# Patient Record
Sex: Male | Born: 1975 | Race: White | Hispanic: No | Marital: Married | Smoking: Never smoker
Health system: Southern US, Community
[De-identification: ages and names within clinical notes are randomized; demographics above are authoritative.]

## PROBLEM LIST (undated history)

## (undated) DIAGNOSIS — T7840XA Allergy, unspecified, initial encounter: Secondary | ICD-10-CM

## (undated) HISTORY — DX: Allergy, unspecified, initial encounter: T78.40XA

---

## 2004-04-28 ENCOUNTER — Ambulatory Visit: Payer: Self-pay | Admitting: Internal Medicine

## 2004-09-11 ENCOUNTER — Ambulatory Visit: Payer: Self-pay | Admitting: Internal Medicine

## 2005-02-16 ENCOUNTER — Ambulatory Visit: Payer: Self-pay | Admitting: Internal Medicine

## 2005-11-22 ENCOUNTER — Ambulatory Visit: Payer: Self-pay | Admitting: Internal Medicine

## 2006-04-15 ENCOUNTER — Ambulatory Visit: Payer: Self-pay | Admitting: Internal Medicine

## 2006-08-05 ENCOUNTER — Ambulatory Visit: Payer: Self-pay | Admitting: Internal Medicine

## 2007-11-27 ENCOUNTER — Ambulatory Visit: Payer: Self-pay | Admitting: Internal Medicine

## 2007-11-27 DIAGNOSIS — M1710 Unilateral primary osteoarthritis, unspecified knee: Secondary | ICD-10-CM | POA: Insufficient documentation

## 2008-06-06 ENCOUNTER — Telehealth: Payer: Self-pay | Admitting: Internal Medicine

## 2008-07-24 ENCOUNTER — Telehealth: Payer: Self-pay | Admitting: Internal Medicine

## 2008-07-25 ENCOUNTER — Ambulatory Visit: Payer: Self-pay | Admitting: Internal Medicine

## 2008-07-25 DIAGNOSIS — J309 Allergic rhinitis, unspecified: Secondary | ICD-10-CM | POA: Insufficient documentation

## 2008-07-25 DIAGNOSIS — R05 Cough: Secondary | ICD-10-CM

## 2008-07-25 DIAGNOSIS — J189 Pneumonia, unspecified organism: Secondary | ICD-10-CM | POA: Insufficient documentation

## 2008-07-29 ENCOUNTER — Encounter: Payer: Self-pay | Admitting: Internal Medicine

## 2008-08-13 ENCOUNTER — Ambulatory Visit: Payer: Self-pay | Admitting: Internal Medicine

## 2008-08-13 DIAGNOSIS — B951 Streptococcus, group B, as the cause of diseases classified elsewhere: Secondary | ICD-10-CM

## 2009-05-22 ENCOUNTER — Telehealth: Payer: Self-pay | Admitting: Internal Medicine

## 2009-06-04 ENCOUNTER — Emergency Department (HOSPITAL_COMMUNITY): Admission: EM | Admit: 2009-06-04 | Discharge: 2009-06-04 | Payer: Self-pay | Admitting: Emergency Medicine

## 2010-10-29 ENCOUNTER — Encounter: Payer: Self-pay | Admitting: Family Medicine

## 2010-10-29 ENCOUNTER — Ambulatory Visit (INDEPENDENT_AMBULATORY_CARE_PROVIDER_SITE_OTHER): Payer: BC Managed Care – PPO | Admitting: Family Medicine

## 2010-10-29 VITALS — BP 100/70 | Temp 98.3°F | Wt 162.0 lb

## 2010-10-29 DIAGNOSIS — J029 Acute pharyngitis, unspecified: Secondary | ICD-10-CM

## 2010-10-29 NOTE — Progress Notes (Signed)
  Subjective:    Patient ID: Kevin Ortega, male    DOB: 05-Aug-1975, 35 y.o.   MRN: [id number]  HPI  patient seen with acute onset last night sore throat, possible low-grade fever, body aches, chills. No nasal congestion no cough. No nausea or vomiting. No skin rash. Some relief with ibuprofen. Possible exposure to strep recently.   Review of Systems  Constitutional: Positive for fever and chills.  HENT: Positive for sore throat. Negative for congestion and sinus pressure.   Respiratory: Negative for cough and shortness of breath.   Gastrointestinal: Negative for abdominal pain.       Objective:   Physical Exam  Constitutional: He appears well-developed and well-nourished. No distress.  HENT:  Right Ear: External ear normal.  Left Ear: External ear normal.       Patient has some erythema posterior pharynx. Scattered small aphthous ulcers. No exudate.  Cardiovascular: Normal rate, regular rhythm and normal heart sounds.   Pulmonary/Chest: Effort normal and breath sounds normal. No respiratory distress. He has no wheezes. He has no rales.  Skin: No rash noted.          Assessment & Plan:  Pharyngitis. Rule out strep. If rapid strep negative treat symptomatically

## 2010-10-29 NOTE — Patient Instructions (Signed)
Continue Advil for symptom relief. Drink plenty of fluids.

## 2010-11-03 ENCOUNTER — Ambulatory Visit (INDEPENDENT_AMBULATORY_CARE_PROVIDER_SITE_OTHER): Payer: BC Managed Care – PPO | Admitting: Family Medicine

## 2010-11-03 ENCOUNTER — Encounter: Payer: Self-pay | Admitting: Family Medicine

## 2010-11-03 VITALS — BP 110/78 | Temp 98.5°F

## 2010-11-03 DIAGNOSIS — R05 Cough: Secondary | ICD-10-CM

## 2010-11-03 DIAGNOSIS — H109 Unspecified conjunctivitis: Secondary | ICD-10-CM

## 2010-11-03 MED ORDER — TOBRAMYCIN 0.3 % OP SOLN: 1.0000 [drp] | OPHTHALMIC | Status: AC

## 2010-11-03 MED ORDER — HYDROCODONE-HOMATROPINE 5-1.5 MG/5ML PO SYRP: 5.0000 mL | ORAL_SOLUTION | ORAL | Status: AC | PRN

## 2010-11-03 MED ORDER — AZITHROMYCIN 250 MG PO TABS: 250.0000 mg | ORAL_TABLET | Freq: Every day | ORAL | Status: AC

## 2010-11-03 NOTE — Progress Notes (Signed)
  Subjective:    Patient ID: Kevin Ortega, male    DOB: 1976/03/13, 35 y.o.   MRN: [id number]  HPI Patient seen as a work in. Recently seen for pharyngitis with negative rapid strep. Now presents with new symptoms of laryngitis and cough which is mostly nonproductive. Sinus pressure and pain. Right erythematous and matted eye with early morning drainage. No blurred vision. Intermittent night sweats past week. Continued malaise. Not monitoring temperature.   Review of Systems  Constitutional: Positive for fatigue. Negative for fever, appetite change and unexpected weight change.  HENT: Positive for congestion and sore throat.   Respiratory: Positive for cough. Negative for shortness of breath and wheezing.   Cardiovascular: Negative for chest pain.       Objective:   Physical Exam  Constitutional: He appears well-developed and well-nourished. No distress.  HENT:  Right Ear: External ear normal.  Left Ear: External ear normal.       Small aphthous type ulcer uvula. Mild posterior pharynx erythema. No exudate. Right conjunctiva is erythematous. Pupils equal round reactive to light. No corneal defects  Neck: Neck supple.  Cardiovascular: Normal rate and regular rhythm.   No murmur heard. Pulmonary/Chest: Effort normal and breath sounds normal. No respiratory distress. He has no wheezes. He has no rales.  Lymphadenopathy:    He has no cervical adenopathy.          Assessment & Plan:  #1 right bacterial conjunctivitis. Tobrex eyedrops every 4 hours while awake and continue warm compresses #2 cough with question of some persistent fever. No evidence for pneumonia by exam. Given duration of symptoms start Zithromax. Hycodan for cough suppression

## 2010-11-26 ENCOUNTER — Encounter: Payer: Self-pay | Admitting: *Deleted

## 2010-11-27 ENCOUNTER — Encounter: Payer: Self-pay | Admitting: Internal Medicine

## 2010-11-27 ENCOUNTER — Ambulatory Visit (INDEPENDENT_AMBULATORY_CARE_PROVIDER_SITE_OTHER): Payer: BC Managed Care – PPO | Admitting: Internal Medicine

## 2010-11-27 VITALS — BP 120/78 | HR 72 | Temp 98.2°F | Resp 16 | Ht 72.0 in | Wt 160.0 lb

## 2010-11-27 DIAGNOSIS — L219 Seborrheic dermatitis, unspecified: Secondary | ICD-10-CM

## 2010-11-27 DIAGNOSIS — R05 Cough: Secondary | ICD-10-CM

## 2010-11-27 DIAGNOSIS — L210 Seborrhea capitis: Secondary | ICD-10-CM

## 2010-11-27 DIAGNOSIS — J321 Chronic frontal sinusitis: Secondary | ICD-10-CM | POA: Insufficient documentation

## 2010-11-27 DIAGNOSIS — J42 Unspecified chronic bronchitis: Secondary | ICD-10-CM | POA: Insufficient documentation

## 2010-11-27 MED ORDER — CLOTRIMAZOLE-BETAMETHASONE 1-0.05 % EX LOTN: TOPICAL_LOTION | Freq: Two times a day (BID) | CUTANEOUS | Status: AC

## 2010-11-27 MED ORDER — LEVOFLOXACIN 500 MG PO TABS: 500.0000 mg | ORAL_TABLET | Freq: Every day | ORAL | Status: AC

## 2010-11-27 NOTE — Patient Instructions (Signed)
Mucinex Fast  Max cough and cold liquid

## 2010-11-27 NOTE — Progress Notes (Signed)
  Subjective:    Patient ID: Kevin Ortega, male    DOB: 16-Jul-1975, 35 y.o.   MRN: [id number]  HPI  Patient is a 35 year old white male who presents after having had 2 courses of antibiotics in the past month for what was felt to be an upper respiratory tract infection.  The second was a Z-Pak he stated that it made him to temporarily better but a week later he was having congestion cough sore throat similar symptoms that he had before although not as disabling.  He denies any current fevers chills nausea or vomiting he has had a history of an upper respiratory tract infection that was diagnosed as pneumonia one year ago and a history of strep.    Review of Systems  Constitutional: Negative for fever and fatigue.  HENT: Positive for congestion and rhinorrhea. Negative for hearing loss, neck pain and postnasal drip.   Eyes: Negative for discharge, redness and visual disturbance.  Respiratory: Negative for cough, shortness of breath and wheezing.   Cardiovascular: Negative for leg swelling.  Gastrointestinal: Negative for abdominal pain, constipation and abdominal distention.  Genitourinary: Negative for urgency and frequency.  Musculoskeletal: Negative for joint swelling and arthralgias.  Skin: Negative for color change and rash.  Neurological: Positive for weakness. Negative for light-headedness.  Hematological: Negative for adenopathy.  Psychiatric/Behavioral: Negative for behavioral problems.   Past Medical History  Diagnosis Date  . Allergy    History reviewed. No pertinent past surgical history.  reports that he has never smoked. He does not have any smokeless tobacco history on file. He reports that he does not drink alcohol or use illicit drugs. family history is not on file. No Known Allergies     Objective:   Physical Exam  Constitutional: He is oriented to person, place, and time. He appears well-developed and well-nourished.  HENT:  Head: Normocephalic and  atraumatic.  Mouth/Throat: Oropharyngeal exudate present.  Eyes: Conjunctivae are normal. Pupils are equal, round, and reactive to light. Right eye exhibits discharge.  Neck: Normal range of motion. Neck supple.  Cardiovascular: Normal rate and regular rhythm.   Pulmonary/Chest: Effort normal and breath sounds normal.  Abdominal: Soft. Bowel sounds are normal.  Musculoskeletal: Normal range of motion.  Neurological: He is alert and oriented to person, place, and time.  Skin: Skin is warm and dry.          Assessment & Plan:  His physical examination today is most consistent with chronic sinusitis rather than acute pharyngitis  There is significant posterior nasal drip that is purulent in nature we will put him on Levaquin 500 by mouth daily for 10 days and Mucinex twice a day for 10 days.  Should his symptoms not abate he should be referred to an internist and throat doctor after a CT of the sinuses has been obtained.  He is due to complete physical examination and will be scheduled for physical in 3 or 4 months Next issue he pointed to a rash on the occiput of his head where his catheter causes moisture to accumulate as apparently a seborrheic or contact dermatitis from baseball cap and/or heart that we gave him Lotrisone cream to apply twice daily to the site when the rash appears and counseled him on of the hard hat band

## 2011-03-31 ENCOUNTER — Other Ambulatory Visit (INDEPENDENT_AMBULATORY_CARE_PROVIDER_SITE_OTHER): Payer: BC Managed Care – PPO

## 2011-03-31 DIAGNOSIS — Z0000 Encounter for general adult medical examination without abnormal findings: Secondary | ICD-10-CM

## 2011-03-31 LAB — CBC WITH DIFFERENTIAL/PLATELET
Basophils Relative: 0.4 % (ref 0.0–3.0)
Eosinophils Absolute: 0.1 10*3/uL (ref 0.0–0.7)
Eosinophils Relative: 2.3 % (ref 0.0–5.0)
Hemoglobin: 14.7 g/dL (ref 13.0–17.0)
Lymphocytes Relative: 21.6 % (ref 12.0–46.0)
MCHC: 34.6 g/dL (ref 30.0–36.0)
Monocytes Relative: 8 % (ref 3.0–12.0)
Neutro Abs: 3.5 10*3/uL (ref 1.4–7.7)
Neutrophils Relative %: 67.7 % (ref 43.0–77.0)
RBC: 4.68 Mil/uL (ref 4.22–5.81)
WBC: 5.2 10*3/uL (ref 4.5–10.5)

## 2011-03-31 LAB — POCT URINALYSIS DIPSTICK
Bilirubin, UA: NEGATIVE
Glucose, UA: NEGATIVE
Leukocytes, UA: NEGATIVE
Nitrite, UA: NEGATIVE
Urobilinogen, UA: 0.2
pH, UA: 7.5

## 2011-03-31 LAB — BASIC METABOLIC PANEL
CO2: 32 mEq/L (ref 19–32)
Calcium: 9.5 mg/dL (ref 8.4–10.5)
Creatinine, Ser: 0.9 mg/dL (ref 0.4–1.5)
GFR: 103.88 mL/min (ref >60.00)
Sodium: 141 mEq/L (ref 135–145)

## 2011-03-31 LAB — HEPATIC FUNCTION PANEL
AST: 31 U/L (ref 0–37)
Albumin: 4.5 g/dL (ref 3.5–5.2)
Alkaline Phosphatase: 57 U/L (ref 39–117)
Bilirubin, Direct: 0.1 mg/dL (ref 0.0–0.3)
Total Protein: 7.2 g/dL (ref 6.0–8.3)

## 2011-03-31 LAB — LIPID PANEL
HDL: 56.4 mg/dL (ref >39.00)
Total CHOL/HDL Ratio: 3

## 2011-04-07 ENCOUNTER — Ambulatory Visit (INDEPENDENT_AMBULATORY_CARE_PROVIDER_SITE_OTHER): Payer: BC Managed Care – PPO | Admitting: Internal Medicine

## 2011-04-07 ENCOUNTER — Encounter: Payer: Self-pay | Admitting: Internal Medicine

## 2011-04-07 VITALS — BP 124/78 | HR 60 | Temp 98.1°F | Resp 14 | Ht 70.0 in | Wt 163.0 lb

## 2011-04-07 DIAGNOSIS — Z23 Encounter for immunization: Secondary | ICD-10-CM

## 2011-04-07 DIAGNOSIS — A4902 Methicillin resistant Staphylococcus aureus infection, unspecified site: Secondary | ICD-10-CM

## 2011-04-07 DIAGNOSIS — Z0000 Encounter for general adult medical examination without abnormal findings: Secondary | ICD-10-CM

## 2011-04-07 MED ORDER — DOXYCYCLINE HYCLATE 100 MG PO TABS: 100.0000 mg | ORAL_TABLET | Freq: Two times a day (BID) | ORAL | Status: AC

## 2011-04-07 MED ORDER — DOXYCYCLINE HYCLATE 100 MG PO TABS: 100.0000 mg | ORAL_TABLET | Freq: Two times a day (BID) | ORAL | Status: DC

## 2011-04-07 NOTE — Patient Instructions (Signed)
The patient is instructed to continue all medications as prescribed. Schedule followup with check out clerk upon leaving the clinic  

## 2011-04-07 NOTE — Progress Notes (Signed)
  Subjective:    Patient ID: Kevin Ortega, male    DOB: 19-Sep-1975, 35 y.o.   MRN: [id number]  HPI  cpx Has pustules on legs that occur and then heal They began after October on a hunting trip No exposure  "bugs bites" New since october  Review of Systems  Constitutional: Negative for fever and fatigue.  HENT: Negative for hearing loss, congestion, neck pain and postnasal drip.   Eyes: Negative for discharge, redness and visual disturbance.  Respiratory: Negative for cough, shortness of breath and wheezing.   Cardiovascular: Negative for leg swelling.  Gastrointestinal: Negative for abdominal pain, constipation and abdominal distention.  Genitourinary: Negative for urgency and frequency.  Musculoskeletal: Negative for joint swelling and arthralgias.  Skin: Negative for color change and rash.  Neurological: Negative for weakness and light-headedness.  Hematological: Negative for adenopathy.  Psychiatric/Behavioral: Negative for behavioral problems.   Past Medical History  Diagnosis Date  . Allergy    No past surgical history on file.  reports that he has never smoked. He does not have any smokeless tobacco history on file. He reports that he does not drink alcohol or use illicit drugs. family history is not on file. No Known Allergies      Objective:   Physical Exam  Nursing note and vitals reviewed. Constitutional: He appears well-developed and well-nourished.  HENT:  Head: Normocephalic and atraumatic.  Eyes: Conjunctivae are normal. Pupils are equal, round, and reactive to light.  Neck: Normal range of motion. Neck supple.  Cardiovascular: Normal rate and regular rhythm.   Pulmonary/Chest: Effort normal and breath sounds normal.  Abdominal: Soft. Bowel sounds are normal.   Examination the skin reveals small posterior lesions on the thighs anteriorly bilateral no other lesions are seen other parts of the skin testicles are normal bilateral no evidence of hernia  prostate is normal size and consistency neurologic examination was nonfocal neuromuscular examination normal examination extremities normal       Assessment & Plan:   Patient presents for yearly preventative medicine examination.   all immunizations and health maintenance protocols were reviewed with the patient and they are up to date with these protocols.   screening laboratory values were reviewed with the patient including screening of hyperlipidemia PSA renal function and hepatic function.   There medications past medical history social history problem list and allergies were reviewed in detail.   Goals were established with regard to weight loss exercise diet in compliance with medications

## 2011-05-31 ENCOUNTER — Telehealth: Payer: Self-pay | Admitting: *Deleted

## 2011-05-31 MED ORDER — CEPHALEXIN 500 MG PO CAPS: 500.0000 mg | ORAL_CAPSULE | Freq: Four times a day (QID) | ORAL | Status: AC

## 2011-05-31 NOTE — Telephone Encounter (Signed)
Pt states the staph infection cleared up but is returning.

## 2011-05-31 NOTE — Telephone Encounter (Signed)
Per dr Lovell Sheehan- may have keflex 500 4 times a day for 7 days

## 2011-05-31 NOTE — Telephone Encounter (Signed)
Pt. Notified.

## 2011-06-21 ENCOUNTER — Telehealth: Payer: Self-pay | Admitting: Internal Medicine

## 2011-06-21 MED ORDER — SULFAMETHOXAZOLE-TRIMETHOPRIM 800-160 MG PO TABS: 1.0000 | ORAL_TABLET | Freq: Two times a day (BID) | ORAL | Status: AC

## 2011-06-21 NOTE — Telephone Encounter (Signed)
Pt called and said that staph inf has still not cleared up. 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 Pt wants to know if he would still be ok to go to appt? The infection is somewhat better, but not completely gone. Pls advise. Pt does need refill of Keflex.

## 2011-06-21 NOTE — Telephone Encounter (Signed)
Per dr Lovell Sheehan- as long as the infection is not in the area where surgery will be it is ok- if it is let us know-- per dr Lovell Sheehan give him septra ds bid for 14 days

## 2011-06-21 NOTE — Telephone Encounter (Signed)
Pt.notified

## 2012-03-22 ENCOUNTER — Encounter: Payer: Self-pay | Admitting: Internal Medicine

## 2012-03-22 ENCOUNTER — Ambulatory Visit (INDEPENDENT_AMBULATORY_CARE_PROVIDER_SITE_OTHER): Payer: BC Managed Care – PPO | Admitting: Internal Medicine

## 2012-03-22 ENCOUNTER — Ambulatory Visit (INDEPENDENT_AMBULATORY_CARE_PROVIDER_SITE_OTHER)
Admission: RE | Admit: 2012-03-22 | Discharge: 2012-03-22 | Disposition: A | Discharge status: Home / Self Care | Payer: BC Managed Care – PPO | Source: Ambulatory Visit | Attending: Internal Medicine | Admitting: Internal Medicine

## 2012-03-22 VITALS — BP 130/80 | HR 72 | Temp 98.3°F | Resp 16 | Ht 70.0 in | Wt 162.0 lb

## 2012-03-22 DIAGNOSIS — R109 Unspecified abdominal pain: Secondary | ICD-10-CM

## 2012-03-22 DIAGNOSIS — K650 Generalized (acute) peritonitis: Secondary | ICD-10-CM

## 2012-03-22 LAB — COMPREHENSIVE METABOLIC PANEL
AST: 25 U/L (ref 0–37)
Albumin: 4 g/dL (ref 3.5–5.2)
Alkaline Phosphatase: 54 U/L (ref 39–117)
BUN: 16 mg/dL (ref 6–23)
Glucose, Bld: 75 mg/dL (ref 70–99)
Potassium: 3.9 mEq/L (ref 3.5–5.1)
Total Bilirubin: 0.6 mg/dL (ref 0.3–1.2)

## 2012-03-22 LAB — CBC WITH DIFFERENTIAL/PLATELET
Basophils Relative: 0.3 % (ref 0.0–3.0)
Eosinophils Absolute: 0.1 10*3/uL (ref 0.0–0.7)
Eosinophils Relative: 1 % (ref 0.0–5.0)
HCT: 39.6 % (ref 39.0–52.0)
Hemoglobin: 13.2 g/dL (ref 13.0–17.0)
Lymphs Abs: 1 10*3/uL (ref 0.7–4.0)
MCHC: 33.4 g/dL (ref 30.0–36.0)
MCV: 89.3 fl (ref 78.0–100.0)
Monocytes Absolute: 0.6 10*3/uL (ref 0.1–1.0)
Neutro Abs: 4.7 10*3/uL (ref 1.4–7.7)
Neutrophils Relative %: 73.7 % (ref 43.0–77.0)
RBC: 4.43 Mil/uL (ref 4.22–5.81)
WBC: 6.4 10*3/uL (ref 4.5–10.5)

## 2012-03-22 MED ORDER — IOHEXOL 300 MG/ML  SOLN
100.0000 mL | Freq: Once | INTRAMUSCULAR | Status: AC | PRN
  Administered 2012-03-22: 100 mL via INTRAVENOUS

## 2012-03-22 MED ORDER — CEFTRIAXONE SODIUM 1 G IJ SOLR
1.0000 g | INTRAMUSCULAR | Status: AC
  Administered 2012-03-22: 1 g via INTRAMUSCULAR

## 2012-03-22 NOTE — Progress Notes (Signed)
  Subjective:    Patient ID: Kevin Ortega, male    DOB: 1975-12-22, 36 y.o.   MRN: [id number]  HPI Has noted early satiety, bloating and radiation to the back Has noted less BM but normal in color Has mild hernia pain from left inguinal hernia Has not noted gas No dark stools No unexpected weight loss No ETOH No tobacco Patient has extreme tenderness of his abdomen wall and states that he has been bloating he is a thin individual has never noted abdominal bloating before     Review of Systems  Constitutional: Positive for fever and fatigue.  HENT: Negative for hearing loss, congestion, neck pain and postnasal drip.   Eyes: Negative for discharge, redness and visual disturbance.  Respiratory: Negative for cough, shortness of breath and wheezing.   Cardiovascular: Negative for leg swelling.  Gastrointestinal: Positive for abdominal pain, constipation and abdominal distention.  Genitourinary: Negative for urgency and frequency.  Musculoskeletal: Positive for back pain. Negative for joint swelling and arthralgias.  Skin: Negative for color change and rash.  Neurological: Negative for weakness and light-headedness.  Hematological: Negative for adenopathy.  Psychiatric/Behavioral: Negative for behavioral problems.   Past Medical History  Diagnosis Date  . Allergy     History   Social History  . Marital Status: Married    Spouse Name: N/A    Number of Children: N/A  . Years of Education: N/A   Occupational History  . Not on file.   Social History Main Topics  . Smoking status: Never Smoker   . Smokeless tobacco: Not on file  . Alcohol Use: No  . Drug Use: No  . Sexually Active: Yes   Other Topics Concern  . Not on file   Social History Narrative  . No narrative on file    No past surgical history on file.  No family history on file.  No Known Allergies  No current outpatient prescriptions on file prior to visit.    BP 130/80  Pulse 72  Temp 98.3 F  (36.8 C)  Resp 16  Ht 5\' 10"  (1.778 m)  Wt 162 lb (73.483 kg)  BMI 23.24 kg/m2       Objective:   Physical Exam  Nursing note and vitals reviewed. Constitutional: He appears well-developed and well-nourished.  Cardiovascular: Normal rate and regular rhythm.   No murmur heard. Pulmonary/Chest: Effort normal and breath sounds normal. No respiratory distress. He has no wheezes. He has no rales.  Abdominal: He exhibits distension. He exhibits no mass. There is tenderness. There is rebound and guarding.  Genitourinary:       Left indirect inguinal hernia  Musculoskeletal: Normal range of motion.  Skin: Skin is warm and dry.  Psychiatric: He has a normal mood and affect. His behavior is normal.          Assessment & Plan:  The differential diagnosis was abdominal pain includes probable subacute appendicitis with peritonitis but also would include other etiologies such as a perforated peptic ulcer.  Will do CBC differential and a stat CT of the abdomen and pelvis this afternoon. I've also suggested that he begin a proton pump inhibitor as well as an injection of Rocephin 1 g we'll obtain the blood work first before the Rocephin injection

## 2012-03-22 NOTE — Addendum Note (Signed)
Addended by: Willy Eddy on: 03/22/2012 12:32 PM   Modules accepted: Orders

## 2012-03-22 NOTE — Patient Instructions (Signed)
CT scan today at 1 PM at  [facility] radiology on [facility] in the bloating marked [facility] on the third floor

## 2013-06-20 IMAGING — CT CT ABD-PELV W/ CM
2 of 4 series · 17 of 46 positions shown, 19 images · IV contrast (Omnipaque 300)
Comparison: None.

CLINICAL DATA: Right arm pain.. Peritoneal signs.

CT ABDOMEN AND PELVIS WITH CONTRAST
TECHNIQUE: Multidetector CT imaging of the abdomen and pelvis was
performed following the standard protocol during bolus
administration of intravenous contrast.
Contrast: 100mL OMNIPAQUE IOHEXOL 300 MG/ML  SOLN

[Series 2: abd/ pel 5mm · axial · 0.63mm/px · z∈[-492,-62]mm · 14 of 94 slices shown, 16 images]
[im 4/94  soft-tissue]
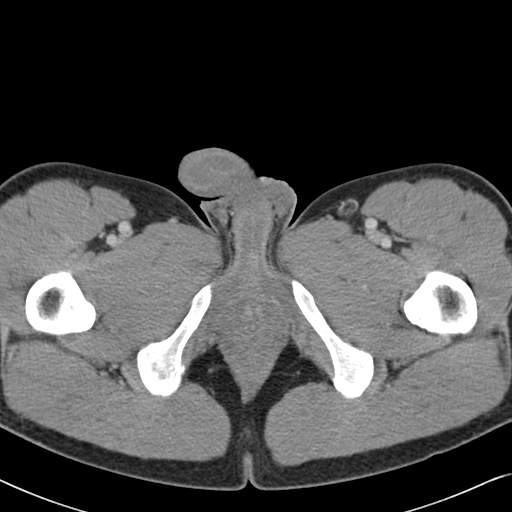
[im 4/94  bone]
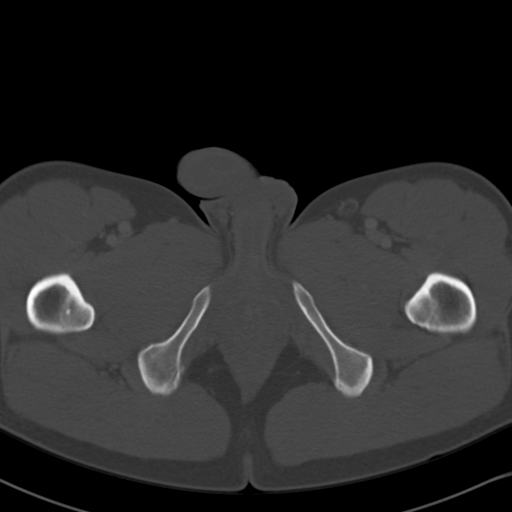
[im 11/94  soft-tissue]
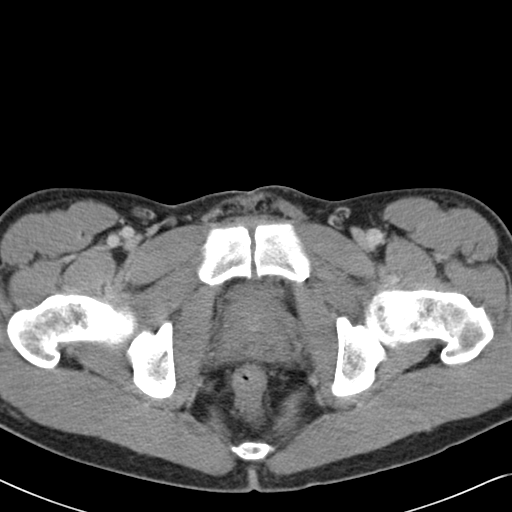
[im 18/94  soft-tissue]
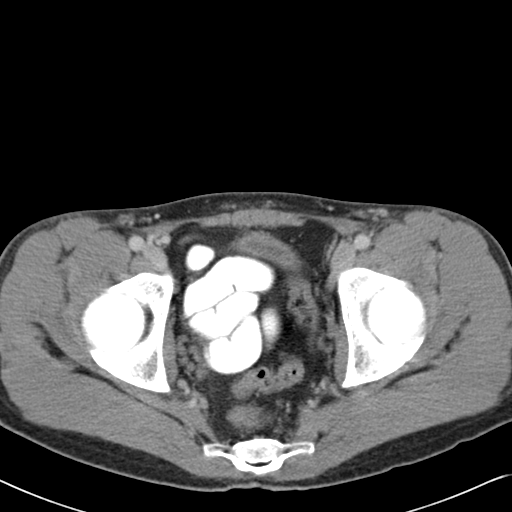
[im 26/94  soft-tissue]
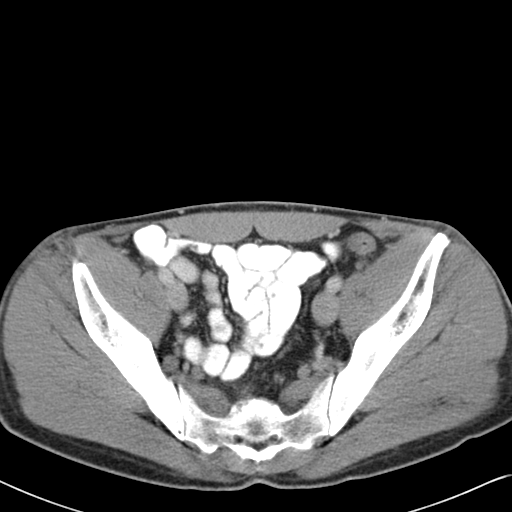
[im 33/94  soft-tissue]
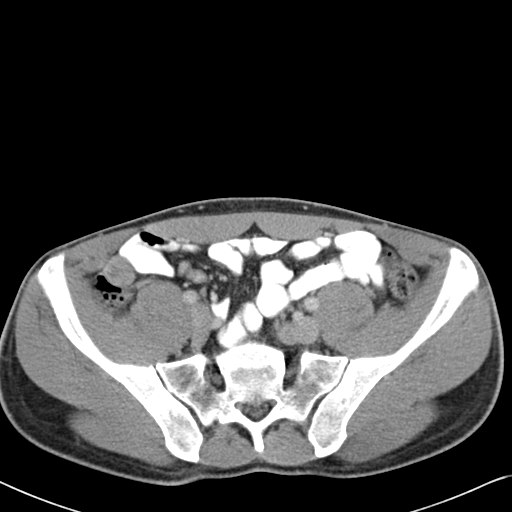
[im 36/94  soft-tissue]
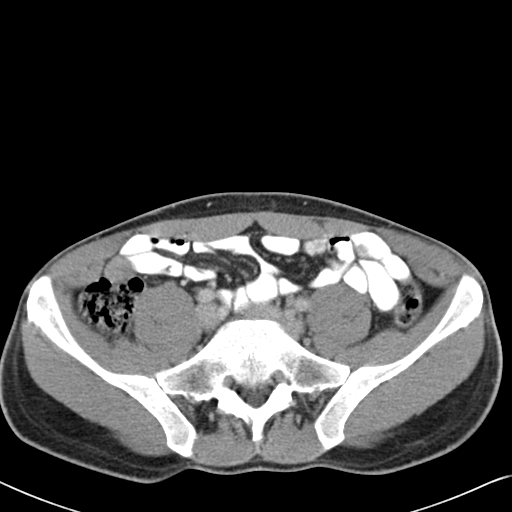
[im 43/94  soft-tissue]
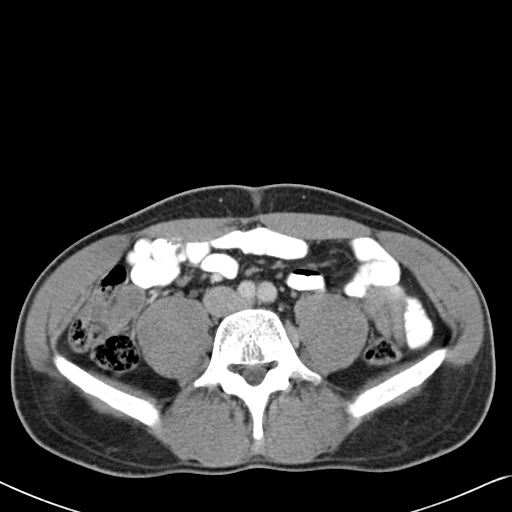
[im 51/94  soft-tissue]
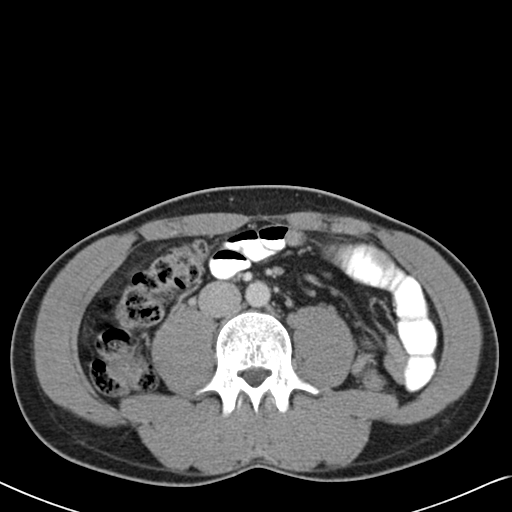
[im 58/94  soft-tissue]
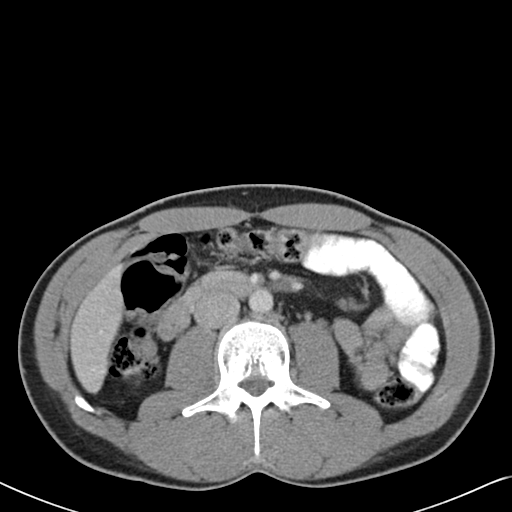
[im 58/94  bone]
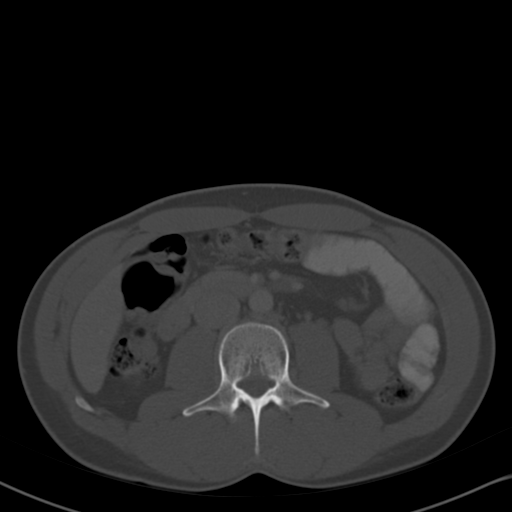
[im 61/94  soft-tissue]
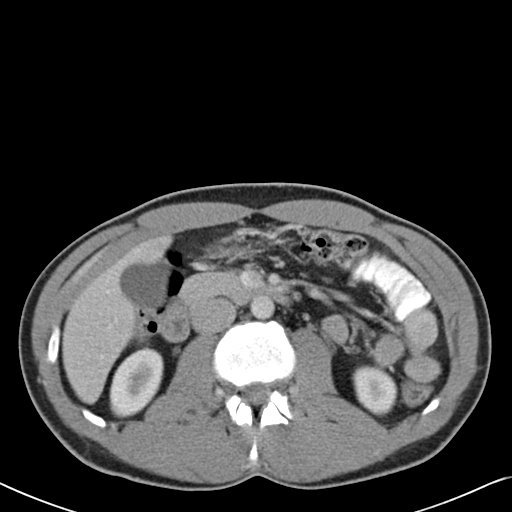
[im 68/94  soft-tissue]
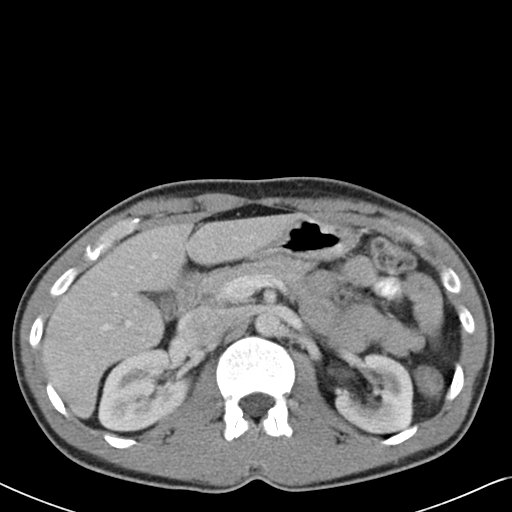
[im 76/94  soft-tissue]
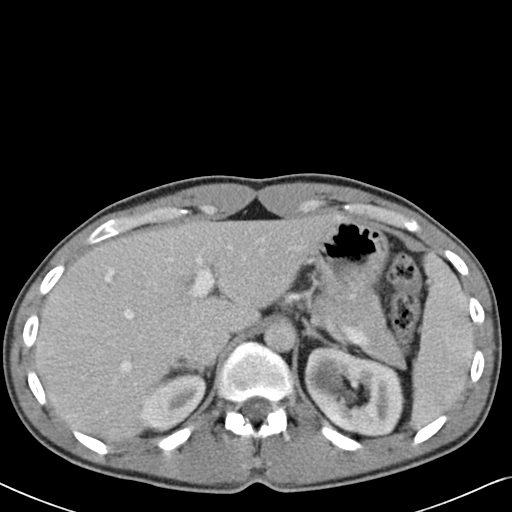
[im 83/94  soft-tissue]
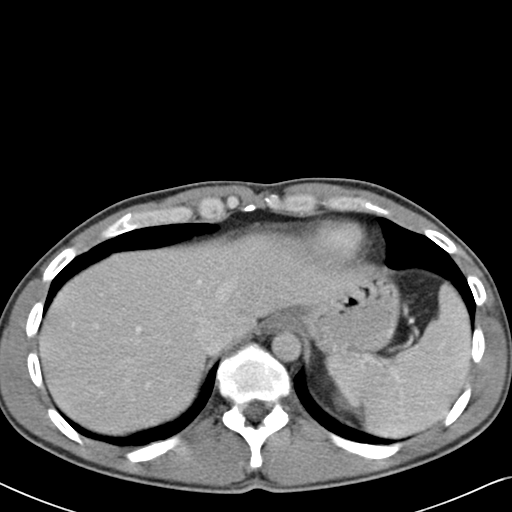
[im 90/94  soft-tissue]
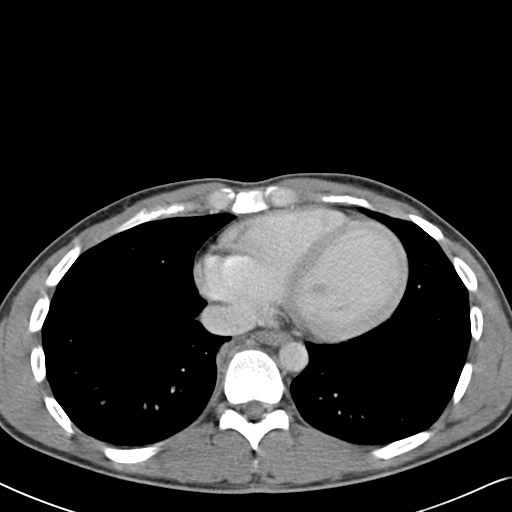

[Series 602: cor · coronal · 0.95mm/px · 3 of 84 slices shown]
[im 28/84  soft-tissue]
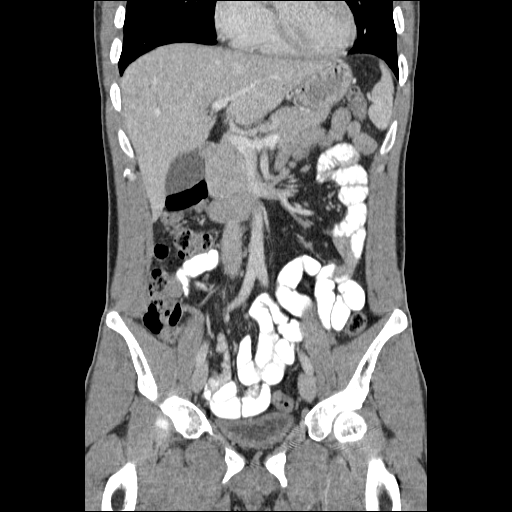
[im 37/84  soft-tissue]
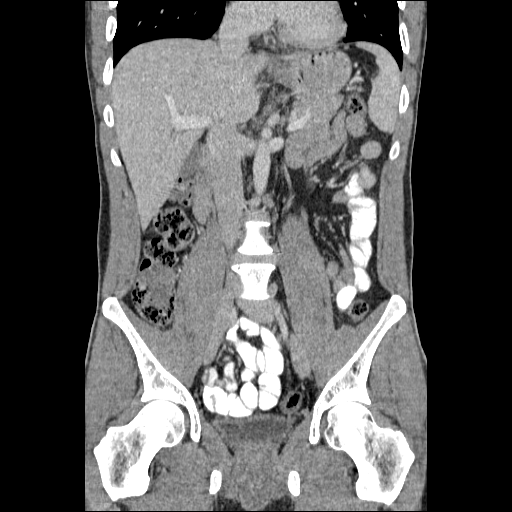
[im 47/84  soft-tissue]
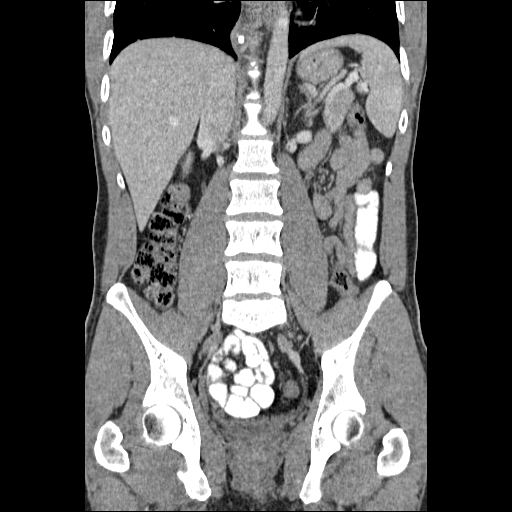

[17 of 46 positions shown; findings below may reference images not displayed]

FINDINGS: Lung bases are clear.  No pleural or pericardial fluid.
The liver has a normal appearance without focal lesions or biliary
ductal dilatation.  No calcified gallstones.  The spleen is normal.
The pancreas is normal.  The adrenal glands are normal.  The right
kidney is normal.  The left kidney contains a benign-appearing cyst
at the upper pole measuring 3.4 cm in diameter.  The aorta and IVC
are normal.  No retroperitoneal mass or adenopathy.  Bladder,
prostate gland and seminal vesicles are normal.

The appendix is normal.  There is no acute bowel finding.  No free
fluid or air.  No significant bony abnormality.
IMPRESSION: Negative examination.  Normal appearing appendix.  No cause of
right lower quadrant pain or peritoneal signs.

## 2013-08-20 ENCOUNTER — Telehealth: Payer: Self-pay | Admitting: Internal Medicine

## 2013-08-20 MED ORDER — ONDANSETRON HCL 4 MG PO TABS: 4.0000 mg | ORAL_TABLET | Freq: Three times a day (TID) | ORAL | Status: DC | PRN

## 2013-08-20 NOTE — Telephone Encounter (Signed)
Wife called for pt to ask if dr Lovell Sheehanjenkins cold call rx in for nausea. Pt has a stomach virus. Advised pt  he may need appt. Pt insisted to ask, stating dr Lovell Sheehanjenkins usually will call in something for them, pt not seen since 02/2012 pls advise

## 2013-08-20 NOTE — Telephone Encounter (Signed)
Zofran okay per Dr Lovell SheehanJenkins.  Patient is aware.

## 2015-07-30 ENCOUNTER — Ambulatory Visit: Payer: BLUE CROSS/BLUE SHIELD

## 2015-08-13 ENCOUNTER — Telehealth: Payer: Self-pay | Admitting: Family Medicine

## 2015-08-13 NOTE — Telephone Encounter (Signed)
Ok. Please schedule new pt/physical if not health issues.

## 2015-08-13 NOTE — Telephone Encounter (Signed)
Pt was a jenkins pt and his wife Percival Spanish(Renee Nippert) is a pt of your. She would like to know if you will accept him as a pt? Pt is not sick, just needs a doctor.

## 2015-08-13 NOTE — Telephone Encounter (Signed)
Kevin Ortega - [facility] me [facility]. You can cal and schedule this patient.

## 2015-09-12 NOTE — Telephone Encounter (Signed)
Pt has been scheduled.  °

## 2015-09-16 ENCOUNTER — Ambulatory Visit (INDEPENDENT_AMBULATORY_CARE_PROVIDER_SITE_OTHER): Payer: BLUE CROSS/BLUE SHIELD | Admitting: Family Medicine

## 2015-09-16 ENCOUNTER — Other Ambulatory Visit (HOSPITAL_COMMUNITY)
Admission: RE | Admit: 2015-09-16 | Discharge: 2015-09-16 | Disposition: A | Discharge status: Home / Self Care | Payer: BLUE CROSS/BLUE SHIELD | Source: Ambulatory Visit | Attending: Family Medicine | Admitting: Family Medicine

## 2015-09-16 ENCOUNTER — Encounter: Payer: Self-pay | Admitting: Family Medicine

## 2015-09-16 VITALS — BP 102/76 | HR 73 | Temp 98.4°F | Ht 69.75 in | Wt 167.4 lb

## 2015-09-16 DIAGNOSIS — Z0001 Encounter for general adult medical examination with abnormal findings: Secondary | ICD-10-CM

## 2015-09-16 DIAGNOSIS — S30861A Insect bite (nonvenomous) of abdominal wall, initial encounter: Secondary | ICD-10-CM | POA: Diagnosis not present

## 2015-09-16 DIAGNOSIS — W57XXXA Bitten or stung by nonvenomous insect and other nonvenomous arthropods, initial encounter: Secondary | ICD-10-CM

## 2015-09-16 DIAGNOSIS — Z113 Encounter for screening for infections with a predominantly sexual mode of transmission: Secondary | ICD-10-CM | POA: Insufficient documentation

## 2015-09-16 DIAGNOSIS — Z7689 Persons encountering health services in other specified circumstances: Secondary | ICD-10-CM

## 2015-09-16 DIAGNOSIS — Z0000 Encounter for general adult medical examination without abnormal findings: Secondary | ICD-10-CM | POA: Diagnosis not present

## 2015-09-16 DIAGNOSIS — R361 Hematospermia: Secondary | ICD-10-CM

## 2015-09-16 DIAGNOSIS — Z7189 Other specified counseling: Secondary | ICD-10-CM | POA: Diagnosis not present

## 2015-09-16 LAB — CBC WITH DIFFERENTIAL/PLATELET
BASOS PCT: 0.4 % (ref 0.0–3.0)
Basophils Absolute: 0 10*3/uL (ref 0.0–0.1)
EOS ABS: 0.1 10*3/uL (ref 0.0–0.7)
Eosinophils Relative: 2.5 % (ref 0.0–5.0)
HCT: 41.4 % (ref 39.0–52.0)
HEMOGLOBIN: 14.1 g/dL (ref 13.0–17.0)
LYMPHS ABS: 0.9 10*3/uL (ref 0.7–4.0)
Lymphocytes Relative: 17.9 % (ref 12.0–46.0)
MCHC: 34.2 g/dL (ref 30.0–36.0)
MCV: 88.6 fl (ref 78.0–100.0)
MONO ABS: 0.4 10*3/uL (ref 0.1–1.0)
Monocytes Relative: 8.4 % (ref 3.0–12.0)
NEUTROS PCT: 70.8 % (ref 43.0–77.0)
Neutro Abs: 3.6 10*3/uL (ref 1.4–7.7)
Platelets: 204 10*3/uL (ref 150.0–400.0)
RBC: 4.67 Mil/uL (ref 4.22–5.81)
RDW: 13.3 % (ref 11.5–15.5)
WBC: 5.1 10*3/uL (ref 4.0–10.5)

## 2015-09-16 LAB — BASIC METABOLIC PANEL
BUN: 14 mg/dL (ref 6–23)
CHLORIDE: 101 meq/L (ref 96–112)
CO2: 33 mEq/L — ABNORMAL HIGH (ref 19–32)
CREATININE: 1.02 mg/dL (ref 0.40–1.50)
Calcium: 9.8 mg/dL (ref 8.4–10.5)
GFR: 86.57 mL/min (ref >60.00)
Glucose, Bld: 78 mg/dL (ref 70–99)
Potassium: 4.2 mEq/L (ref 3.5–5.1)
Sodium: 139 mEq/L (ref 135–145)

## 2015-09-16 LAB — POC URINALSYSI DIPSTICK (AUTOMATED)
Bilirubin, UA: NEGATIVE
Glucose, UA: NEGATIVE
Ketones, UA: NEGATIVE
Leukocytes, UA: NEGATIVE
Nitrite, UA: NEGATIVE
PH UA: 7
PROTEIN UA: NEGATIVE
RBC UA: NEGATIVE
SPEC GRAV UA: 1.02
UROBILINOGEN UA: 0.2

## 2015-09-16 LAB — HDL CHOLESTEROL: HDL: 50.6 mg/dL (ref >39.00)

## 2015-09-16 LAB — CHOLESTEROL, TOTAL: Cholesterol: 180 mg/dL (ref 0–200)

## 2015-09-16 MED ORDER — DOXYCYCLINE HYCLATE 100 MG PO CAPS
100.0000 mg | ORAL_CAPSULE | Freq: Two times a day (BID) | ORAL | Status: DC
Start: 2015-09-16 — End: 2017-08-22

## 2015-09-16 NOTE — Patient Instructions (Signed)
BEFORE YOU LEAVE: -labs -schedule follow up in 1 month  Take the antibiotic as instructed  We recommend the following healthy lifestyle measures: - eat a healthy whole foods diet consisting of regular small meals composed of vegetables, fruits, beans, nuts, seeds, healthy meats such as white chicken and fish and whole grains.  - avoid sweets, white starchy foods, fried foods, fast food, processed foods, sodas, red meet and other fattening foods.  - get a least 150-300 minutes of aerobic exercise per week.   -We have ordered labs or studies at this visit. It can take up to 1-2 weeks for results and processing. We will contact you with instructions IF your results are abnormal. Normal results will [facility]. If you have not heard from us or can not find your results [facility] in 2 weeks please contact our office.

## 2015-09-16 NOTE — Progress Notes (Signed)
HPI:  Here for CPE:  -Concerns and/or follow up today:   Tick bite: -pulled tick off 3 days ago, bit him that day -L flank, a little irritated and swollen at bite site -no fevers, malaise, rash or flu like symptoms  Dysparunia: -for about 1.5 months -mild discomfort with ejaculation -streaks of blood in semen -denies: dysuria, fever, chills, flank pain, hematuria, weak urine stream, testicular pain, malaise, weight loss, nausea, vomiting, pain other times or concern for STIs -no FH urological disorders or cancers  -Diabetes and Dyslipidemia Screening: not fasting  -Hx of HTN: no  -Vaccines: UTD  -sexual activity: yes, male partner, no new partners  -wants STI testing, Hep C screening (if born [id number]): no  -FH colon or prstate ca: see FH Last colon cancer screening: n/a Last prostate ca screening: n/a  -Alcohol, Tobacco, drug use: see social history  Review of Systems - no fevers, unintentional weight loss, vision loss, hearing loss, chest pain, sob, hemoptysis, melena, hematochezia, hematuria, genital discharge, changing or concerning skin lesions, bleeding, bruising, loc, thoughts of self harm or SI  Past Medical History  Diagnosis Date  . Allergy     No past surgical history on file.  No family history on file.  Social History   Social History  . Marital Status: Married    Spouse Name: N/A  . Number of Children: N/A  . Years of Education: N/A   Social History Main Topics  . Smoking status: Never Smoker   . Smokeless tobacco: None  . Alcohol Use: No  . Drug Use: No  . Sexual Activity: Yes   Other Topics Concern  . None   Social History Narrative   Work or School: self employed interior [employer]      Home Situation: lives with wife and two son      Spiritual Beliefs: Christian      Lifestyle: no regular exercise - work is active and has a garden; diet is pretty healthy           Current outpatient prescriptions:  .  doxycycline  (VIBRAMYCIN) 100 MG capsule, Take 1 capsule (100 mg total) by mouth 2 (two) times daily., Disp: 28 capsule, Rfl: 0  EXAM:  Filed Vitals:   09/16/15 0740  BP: 102/76  Pulse: 73  Temp: 98.4 F (36.9 C)  TempSrc: Oral  Height: 5' 9.75" (1.772 m)  Weight: 167 lb 6.4 oz (75.932 kg)    Estimated body mass index is 24.18 kg/(m^2) as calculated from the following:   Height as of this encounter: 5' 9.75" (1.772 m).   Weight as of this encounter: 167 lb 6.4 oz (75.932 kg).  GENERAL: vitals reviewed and listed below, alert, oriented, appears well hydrated and in no acute distress  HEENT: head atraumatic, PERRLA, normal appearance of eyes, ears, nose and mouth. moist mucus membranes.  NECK: supple, no masses or lymphadenopathy  LUNGS: clear to auscultation bilaterally, no rales, rhonchi or wheeze  CV: HRRR, no peripheral edema or cyanosis, normal pedal pulses  ABDOMEN: bowel sounds normal, soft, non tender to palpation, no masses, no rebound or guarding  GU: normal appearance of external genitalia - no lesions or masses, hernia exam normal.  RECTAL: normal  SKIN: no rash or abnormal lesions, many moles, none appear unusual - erythematous papule with small area surrounding edema and induration L upper lateral thigh  MS: normal gait, moves all extremities normally  NEURO: CN II-XII grossly intact, normal muscle strength and sensation to light touch on  extremities  PSYCH: normal affect, pleasant and cooperative  ASSESSMENT AND PLAN:  Discussed the following assessment and plan:  Tick bite - Plan: doxycycline (VIBRAMYCIN) 100 MG capsule -discussed tick born illnesses -mild cellulitis - tx with doxy  Blood in semen - Plan: doxycycline (VIBRAMYCIN) 100 MG capsule, Basic metabolic panel, CBC with Differential -we discussed possible serious and likely etiologies, workup and treatment, treatment risks and return precautions -normal exam -labs, urine studies - assistant to order udip,  micro, culture, GC/Clam -tx with doxy after discussion options/risks -f/u in 1 month to ensure resolution  Encounter to establish care with new doctor Visit for preventive health examination - Plan: Cholesterol, Total, HDL cholesterol -Discussed and advised all [place] preventive services health task force level A and B recommendations for age, sex and risks.  -Advised at least 150 minutes of exercise per week and a healthy diet low in saturated fats and sweets and consisting of fresh fruits and vegetables, lean meats such as fish and white chicken and whole grains.  -labs, studies and vaccines per orders this encounter  Yearly skin exam advised  Patient advised to return to clinic immediately if symptoms worsen or persist or new concerns.  Patient Instructions  BEFORE YOU LEAVE: -labs -schedule follow up in 1 month  Take the antibiotic as instructed  We recommend the following healthy lifestyle measures: - eat a healthy whole foods diet consisting of regular small meals composed of vegetables, fruits, beans, nuts, seeds, healthy meats such as white chicken and fish and whole grains.  - avoid sweets, white starchy foods, fried foods, fast food, processed foods, sodas, red meet and other fattening foods.  - get a least 150-300 minutes of aerobic exercise per week.   -We have ordered labs or studies at this visit. It can take up to 1-2 weeks for results and processing. We will contact you with instructions IF your results are abnormal. Normal results will be released to your [facility]. If you have not heard from [place] or can not find your results in [facility] in 2 weeks please contact our office.           No Follow-up on file.   Kriste Basque R.

## 2015-09-17 LAB — URINE CYTOLOGY ANCILLARY ONLY
Chlamydia: NEGATIVE
NEISSERIA GONORRHEA: NEGATIVE

## 2015-09-18 LAB — URINE CULTURE
Colony Count: NO GROWTH
Organism ID, Bacteria: NO GROWTH

## 2015-10-16 ENCOUNTER — Ambulatory Visit: Payer: BLUE CROSS/BLUE SHIELD | Admitting: Family Medicine

## 2017-08-19 NOTE — Progress Notes (Signed)
HPI:  Using dictation device. Unfortunately this device frequently misinterprets words/phrases.  Here for CPE:  -Concerns and/or follow up today: none  -Diet: variety of foods, balance and well rounded, mostly home cooked an dlikes to garden -Exercise: reports he gets regular exercise -Diabetes and Dyslipidemia Screening:fasting for labs -Hx of HTN: no -Vaccines: UTD, except declined flu vaccine -wants STI testing, Hep C screening (if born 47-1964): no - declined -FH colon or prstate ca: see FH Last colon cancer screening: n/a Last prostate ca screening:n/a -Alcohol, Tobacco, drug use: see social history  Review of Systems - no reported fevers, unintentional weight loss, vision loss, hearing loss, chest pain, sob, hemoptysis, melena, hematochezia, hematuria, genital discharge, changing or concerning skin lesions, bleeding, bruising, loc, thoughts of self harm or SI  Past Medical History:  Diagnosis Date  . Allergy     History reviewed. No pertinent surgical history.  History reviewed. No pertinent family history.  Social History   Socioeconomic History  . Marital status: Married    Spouse name: Not on file  . Number of children: Not on file  . Years of education: Not on file  . Highest education level: Not on file  Occupational History  . Not on file  Social Needs  . Financial resource strain: Not on file  . Food insecurity:    Worry: Not on file    Inability: Not on file  . Transportation needs:    Medical: Not on file    Non-medical: Not on file  Tobacco Use  . Smoking status: Never Smoker  . Smokeless tobacco: Never Used  Substance and Sexual Activity  . Alcohol use: No  . Drug use: No  . Sexual activity: Yes  Lifestyle  . Physical activity:    Days per week: Not on file    Minutes per session: Not on file  . Stress: Not on file  Relationships  . Social connections:    Talks on phone: Not on file    Gets together: Not on file    Attends religious  service: Not on file    Active member of club or organization: Not on file    Attends meetings of clubs or organizations: Not on file    Relationship status: Not on file  Other Topics Concern  . Not on file  Social History Narrative   Work or School: self employed [employer] Situation: lives with wife and two son      Spiritual Beliefs: Christian      Lifestyle: no regular exercise - work is active and has a garden; diet is pretty healthy       No current outpatient medications on file.  EXAM:  Vitals:   08/22/17 0914  BP: 102/80  Pulse: (!) 54  Temp: (!) 97.4 F (36.3 C)  TempSrc: Oral  Weight: 167 lb 11.2 oz (76.1 kg)  Height: 5' 10.25" (1.784 m)    Estimated body mass index is 23.89 kg/m as calculated from the following:   Height as of this encounter: 5' 10.25" (1.784 m).   Weight as of this encounter: 167 lb 11.2 oz (76.1 kg).  GENERAL: vitals reviewed and listed below, alert, oriented, appears well hydrated and in no acute distress  HEENT: head atraumatic, PERRLA, normal appearance of eyes, ears, nose and mouth. moist mucus membranes.  NECK: supple, no masses or lymphadenopathy  LUNGS: clear to auscultation bilaterally, no rales, rhonchi or wheeze  CV: HRRR, no peripheral edema  or cyanosis, normal pedal pulses  ABDOMEN: bowel sounds normal, soft, non tender to palpation, no masses, no rebound or guarding  GU: declined  RECTAL: declined  SKIN: no rash, numerous moles of various shapes, sizes, shades of brown  MS: normal gait, moves all extremities normally  NEURO: normal gait, speech and thought processing grossly intact, muscle tone grossly intact throughout  PSYCH: normal affect, pleasant and cooperative  ASSESSMENT AND PLAN:  Discussed the following assessment and plan:  PREVENTIVE EXAM: -Discussed and advised all [place] preventive services health task force level A and B recommendations for age, sex and risks. -Advised at least 150  minutes of exercise per week and a healthy diet with avoidance of (less then 1 serving per week) processed foods, white starches, red meat, fast foods and sweets and consisting of: * 5-9 servings of fresh fruits and vegetables (not corn or potatoes) *nuts and seeds, beans *olives and olive oil *lean meats such as fish and white chicken  *whole grains -labs, studies and vaccines per orders this encounter  Recommended skin exam with dermatologist.  Patient Instructions  BEFORE YOU LEAVE: -PHQ9 -labs -follow up: yearly for physical  Please see a dermatologist for a skin exam.  Preventive Care 40-64 Years, Male Preventive care refers to lifestyle choices and visits with your health care provider that can promote health and wellness. What does preventive care include?  A yearly physical exam. This is also called an annual well check.  Dental exams once or twice a year.  Routine eye exams. Ask your health care provider how often you should have your eyes checked.  Personal lifestyle choices, including: ? Daily care of your teeth and gums. ? Regular physical activity. ? Eating a healthy diet. ? Avoiding tobacco and drug use. ? Limiting alcohol use. ? Practicing safe sex. ? Taking low-dose aspirin every day starting at age 49. What happens during an annual well check? The services and screenings done by your health care provider during your annual well check will depend on your age, overall health, lifestyle risk factors, and family history of disease. Counseling Your health care provider may ask you questions about your:  Alcohol use.  Tobacco use.  Drug use.  Emotional well-being.  Home and relationship well-being.  Sexual activity.  Eating habits.  Work and work Statistician.  Screening You may have the following tests or measurements:  Height, weight, and BMI.  Blood pressure.  Lipid and cholesterol levels. These may be checked every 5 years, or more  frequently if you are over 24 years old.  Skin check.  Lung cancer screening. You may have this screening every year starting at age 94 if you have a 30-pack-year history of smoking and currently smoke or have quit within the past 15 years.  Fecal occult blood test (FOBT) of the stool. You may have this test every year starting at age 48.  Flexible sigmoidoscopy or colonoscopy. You may have a sigmoidoscopy every 5 years or a colonoscopy every 10 years starting at age 57.  Prostate cancer screening. Recommendations will vary depending on your family history and other risks.  Hepatitis C blood test.  Hepatitis B blood test.  Sexually transmitted disease (STD) testing.  Diabetes screening. This is done by checking your blood sugar (glucose) after you have not eaten for a while (fasting). You may have this done every 1-3 years.  Discuss your test results, treatment options, and if necessary, the need for more tests with your health care provider. Vaccines  Your health care provider may recommend certain vaccines, such as:  Influenza vaccine. This is recommended every year.  Tetanus, diphtheria, and acellular pertussis (Tdap, Td) vaccine. You may need a Td booster every 10 years.  Varicella vaccine. You may need this if you have not been vaccinated.  Zoster vaccine. You may need this after age 12.  Measles, mumps, and rubella (MMR) vaccine. You may need at least one dose of MMR if you were born in 1957 or later. You may also need a second dose.  Pneumococcal 13-valent conjugate (PCV13) vaccine. You may need this if you have certain conditions and have not been vaccinated.  Pneumococcal polysaccharide (PPSV23) vaccine. You may need one or two doses if you smoke cigarettes or if you have certain conditions.  Meningococcal vaccine. You may need this if you have certain conditions.  Hepatitis A vaccine. You may need this if you have certain conditions or if you travel or work in places  where you may be exposed to hepatitis A.  Hepatitis B vaccine. You may need this if you have certain conditions or if you travel or work in places where you may be exposed to hepatitis B.  Haemophilus influenzae type b (Hib) vaccine. You may need this if you have certain risk factors.  Talk to your health care provider about which screenings and vaccines you need and how often you need them. This information is not intended to replace advice given to you by your health care provider. Make sure you discuss any questions you have with your health care provider. Document Released: 06/06/2015 Document Revised: 01/28/2016 Document Reviewed: 03/11/2015 Elsevier Interactive Patient Education  2018 [employer].     No follow-ups on file.   Lucretia Kern, DO

## 2017-08-22 ENCOUNTER — Encounter: Payer: Self-pay | Admitting: Family Medicine

## 2017-08-22 ENCOUNTER — Ambulatory Visit (INDEPENDENT_AMBULATORY_CARE_PROVIDER_SITE_OTHER): Payer: Self-pay | Admitting: Family Medicine

## 2017-08-22 VITALS — BP 102/80 | HR 54 | Temp 97.4°F | Ht 70.25 in | Wt 167.7 lb

## 2017-08-22 DIAGNOSIS — Z1331 Encounter for screening for depression: Secondary | ICD-10-CM

## 2017-08-22 DIAGNOSIS — Z0000 Encounter for general adult medical examination without abnormal findings: Secondary | ICD-10-CM

## 2017-08-22 LAB — LIPID PANEL
CHOLESTEROL: 158 mg/dL (ref 0–200)
HDL: 57.8 mg/dL (ref >39.00)
LDL CALC: 86 mg/dL (ref 0–99)
NonHDL: 100.09
TRIGLYCERIDES: 72 mg/dL (ref 0.0–149.0)
Total CHOL/HDL Ratio: 3
VLDL: 14.4 mg/dL (ref 0.0–40.0)

## 2017-08-22 LAB — HEMOGLOBIN A1C: Hgb A1c MFr Bld: 5.6 % (ref 4.6–6.5)

## 2017-08-22 NOTE — Patient Instructions (Addendum)
BEFORE YOU LEAVE: -PHQ9 -labs -follow up: yearly for physical  Please see a dermatologist for a skin exam.  Preventive Care 40-64 Years, Male Preventive care refers to lifestyle choices and visits with your health care provider that can promote health and wellness. What does preventive care include?  A yearly physical exam. This is also called an annual well check.  Dental exams once or twice a year.  Routine eye exams. Ask your health care provider how often you should have your eyes checked.  Personal lifestyle choices, including: ? Daily care of your teeth and gums. ? Regular physical activity. ? Eating a healthy diet. ? Avoiding tobacco and drug use. ? Limiting alcohol use. ? Practicing safe sex. ? Taking low-dose aspirin every day starting at age 41. What happens during an annual well check? The services and screenings done by your health care provider during your annual well check will depend on your age, overall health, lifestyle risk factors, and family history of disease. Counseling Your health care provider may ask you questions about your:  Alcohol use.  Tobacco use.  Drug use.  Emotional well-being.  Home and relationship well-being.  Sexual activity.  Eating habits.  Work and work Statistician.  Screening You may have the following tests or measurements:  Height, weight, and BMI.  Blood pressure.  Lipid and cholesterol levels. These may be checked every 5 years, or more frequently if you are over 68 years old.  Skin check.  Lung cancer screening. You may have this screening every year starting at age 22 if you have a 30-pack-year history of smoking and currently smoke or have quit within the past 15 years.  Fecal occult blood test (FOBT) of the stool. You may have this test every year starting at age 8.  Flexible sigmoidoscopy or colonoscopy. You may have a sigmoidoscopy every 5 years or a colonoscopy every 10 years starting at age  70.  Prostate cancer screening. Recommendations will vary depending on your family history and other risks.  Hepatitis C blood test.  Hepatitis B blood test.  Sexually transmitted disease (STD) testing.  Diabetes screening. This is done by checking your blood sugar (glucose) after you have not eaten for a while (fasting). You may have this done every 1-3 years.  Discuss your test results, treatment options, and if necessary, the need for more tests with your health care provider. Vaccines Your health care provider may recommend certain vaccines, such as:  Influenza vaccine. This is recommended every year.  Tetanus, diphtheria, and acellular pertussis (Tdap, Td) vaccine. You may need a Td booster every 10 years.  Varicella vaccine. You may need this if you have not been vaccinated.  Zoster vaccine. You may need this after age 41.  Measles, mumps, and rubella (MMR) vaccine. You may need at least one dose of MMR if you were born in 1957 or later. You may also need a second dose.  Pneumococcal 13-valent conjugate (PCV13) vaccine. You may need this if you have certain conditions and have not been vaccinated.  Pneumococcal polysaccharide (PPSV23) vaccine. You may need one or two doses if you smoke cigarettes or if you have certain conditions.  Meningococcal vaccine. You may need this if you have certain conditions.  Hepatitis A vaccine. You may need this if you have certain conditions or if you travel or work in places where you may be exposed to hepatitis A.  Hepatitis B vaccine. You may need this if you have certain conditions or if you  travel or work in places where you may be exposed to hepatitis B.  Haemophilus influenzae type b (Hib) vaccine. You may need this if you have certain risk factors.  Talk to your health care provider about which screenings and vaccines you need and how often you need them. This information is not intended to replace advice given to you by your health  care provider. Make sure you discuss any questions you have with your health care provider. Document Released: 06/06/2015 Document Revised: 01/28/2016 Document Reviewed: 03/11/2015 Elsevier Interactive Patient Education  Sadrac Schein.

## 2020-01-14 ENCOUNTER — Emergency Department (HOSPITAL_COMMUNITY)
Admission: EM | Admit: 2020-01-14 | Discharge: 2020-01-14 | Disposition: A | Discharge status: Home / Self Care | Payer: Self-pay | Attending: Emergency Medicine | Admitting: Emergency Medicine

## 2020-01-14 ENCOUNTER — Emergency Department (HOSPITAL_COMMUNITY): Payer: Self-pay

## 2020-01-14 ENCOUNTER — Encounter (HOSPITAL_COMMUNITY): Payer: Self-pay | Admitting: Emergency Medicine

## 2020-01-14 ENCOUNTER — Other Ambulatory Visit: Payer: Self-pay

## 2020-01-14 DIAGNOSIS — S60451A Superficial foreign body of left index finger, initial encounter: Secondary | ICD-10-CM

## 2020-01-14 DIAGNOSIS — W450XXA Nail entering through skin, initial encounter: Secondary | ICD-10-CM | POA: Insufficient documentation

## 2020-01-14 DIAGNOSIS — Y990 Civilian activity done for income or pay: Secondary | ICD-10-CM | POA: Insufficient documentation

## 2020-01-14 DIAGNOSIS — Y939 Activity, unspecified: Secondary | ICD-10-CM | POA: Insufficient documentation

## 2020-01-14 DIAGNOSIS — S61341A Puncture wound with foreign body of left index finger with damage to nail, initial encounter: Secondary | ICD-10-CM | POA: Insufficient documentation

## 2020-01-14 DIAGNOSIS — Z23 Encounter for immunization: Secondary | ICD-10-CM | POA: Insufficient documentation

## 2020-01-14 DIAGNOSIS — Y929 Unspecified place or not applicable: Secondary | ICD-10-CM | POA: Insufficient documentation

## 2020-01-14 MED ORDER — OXYCODONE-ACETAMINOPHEN 5-325 MG PO TABS
1.0000 | ORAL_TABLET | ORAL | Status: DC | PRN
  Administered 2020-01-14: 1 via ORAL
  Filled 2020-01-14 (×2): qty 1

## 2020-01-14 MED ORDER — LIDOCAINE HCL (PF) 1 % IJ SOLN
10.0000 mL | Freq: Once | INTRAMUSCULAR | Status: DC
  Filled 2020-01-14: qty 10

## 2020-01-14 MED ORDER — CEPHALEXIN 500 MG PO CAPS: 500.0000 mg | ORAL_CAPSULE | Freq: Four times a day (QID) | ORAL | 0 refills | Status: AC

## 2020-01-14 MED ORDER — TETANUS-DIPHTH-ACELL PERTUSSIS 5-2.5-18.5 LF-MCG/0.5 IM SUSP
0.5000 mL | Freq: Once | INTRAMUSCULAR | Status: AC
  Administered 2020-01-14: 0.5 mL via INTRAMUSCULAR
  Filled 2020-01-14: qty 0.5

## 2020-01-14 NOTE — ED Notes (Signed)
Reviewed discharge instructions with pt. Pt verbalized understanding. A&Ox4 upon discharge. Pt ambulatory.

## 2020-01-14 NOTE — Discharge Instructions (Signed)
Change dressing daily, you can wash gently with soap and water. Monitor for signs of infection such as redness, swelling, increasing pain or any purulent drainage.  No swimming. Take Keflex 4 times daily as directed.    Dr. Carollee Massed office will call you to schedule close follow-up.  Your tetanus vaccine was updated today and will be good for the next 10 years

## 2020-01-14 NOTE — ED Provider Notes (Signed)
[facility] EMERGENCY DEPARTMENT Provider Note   CSN: [id number] Arrival date & time: 01/14/20  1208     History Chief Complaint  Patient presents with  . Finger Injury    Kevin Ortega is a 44 y.o. male.  Kevin Ortega is a 44 y.o. male who is otherwise healthy, presents to the ED for evaluation of injury to the left index finger.  While at work today he accidentally shot himself in the finger with a nail gun.  He has a small framing nail that enters through the digit pad and is coming out the middle of his nail.  He states that a coworker attempted to use a tool to pull the nail out, they cut the head of the nail off but were unable to remove the nail.  He states this is happened a few times before but he is always been able to remove the nail himself.  He is concerned it may be going through the bone.  He describes pain as a constant aching soreness, rated 6/10.  He is able to move the finger without difficulty and denies any numbness or tingling.  He denies any current bleeding.  He is unsure when his last tetanus vaccine was.        Past Medical History:  Diagnosis Date  . Allergy     Patient Active Problem List   Diagnosis Date Noted  . ALLERGIC RHINITIS 07/25/2008    History reviewed. No pertinent surgical history.     No family history on file.  Social History   Tobacco Use  . Smoking status: Never Smoker  . Smokeless tobacco: Never Used  Substance Use Topics  . Alcohol use: No  . Drug use: No    Home Medications Prior to Admission medications   Medication Sig Start Date End Date Taking? Authorizing Provider  cephALEXin (KEFLEX) 500 MG capsule Take 1 capsule (500 mg total) by mouth 4 (four) times daily. 01/14/20   Dartha Lodge, PA-C    Allergies    Patient has no known allergies.  Review of Systems   Review of Systems  Constitutional: Negative for chills and fever.  Skin: Positive for wound.  Neurological: Negative for weakness  and numbness.    Physical Exam Updated Vital Signs BP 118/76   Pulse 60   Temp 97.6 F (36.4 C) (Oral)   Resp 16   Ht 5\' 10"  (1.778 m)   Wt 72.6 kg   SpO2 99%   BMI 22.96 kg/m   Physical Exam Vitals and nursing note reviewed.  Constitutional:      General: He is not in acute distress.    Appearance: Normal appearance. He is well-developed and normal weight. He is not ill-appearing or diaphoretic.  HENT:     Head: Normocephalic and atraumatic.  Eyes:     General:        Right eye: No discharge.        Left eye: No discharge.  Pulmonary:     Effort: Pulmonary effort is normal. No respiratory distress.  Musculoskeletal:     Comments: Small framing nail present in the left index finger, it enters the finger through the digit pad and is coming out through the center of the nail, there is no current bleeding.  Patient is able to flex and extend the finger without difficulty, his sensation is intact.  Skin:    General: Skin is warm and dry.  Neurological:     Mental  Status: He is alert and oriented to person, place, and time.     Coordination: Coordination normal.  Psychiatric:        Behavior: Behavior normal.     ED Results / Procedures / Treatments   Labs (all labs ordered are listed, but only abnormal results are displayed) Labs Reviewed - No data to display  EKG None  Radiology DG Finger Index Left  Result Date: 01/14/2020 CLINICAL DATA:  44 year old male with penetrating trauma from nail gun. EXAM: LEFT INDEX FINGER 2+V COMPARISON:  None. FINDINGS: Normal underlying bone mineralization, joint spaces and alignment. A linear 22 mm metal foreign body traverses the tuft of the 2nd distal phalanx in an anterior to posterior direction. But the underlying bone appears to remain intact. Other visible osseous structures appear normal. IMPRESSION: Linear 22 mm metal foreign body traverses the tuft of the 2nd distal phalanx. But the underlying bone remains otherwise intact.  Electronically Signed   By: Odessa Fleming M.D.   On: 01/14/2020 12:54    Procedures .Foreign Body Removal  Date/Time: 01/14/2020 3:26 PM Performed by: Dartha Lodge, PA-C Authorized by: Dartha Lodge, PA-C  Body area: skin General location: upper extremity Location details: left index finger Anesthesia: digital block  Anesthesia: Local Anesthetic: lidocaine 1% without epinephrine  Sedation: Patient sedated: no  Patient restrained: no Patient cooperative: yes Localization method: visualized Removal mechanism: Pliers. Dressing: dressing applied Depth: deep Complexity: complex 1 objects recovered. Objects recovered: Metal framing nail Post-procedure assessment: foreign body removed Patient tolerance: patient tolerated the procedure well with no immediate complications Comments: Nail entered to the skin through the digit pad and was coming out at the nail bed and had curved, the nail was cut off close to the nail and then pulled from the digit pad using pliers, per instructions from Dr. Janee Morn with hand surgery a small incision was made through the entry wound on the digit pad to help prevent development of felon.  Patient tolerated procedure very well, dressing applied.   (including critical care time)  Medications Ordered in ED Medications  oxyCODONE-acetaminophen (PERCOCET/ROXICET) 5-325 MG per tablet 1 tablet (1 tablet Oral Given 01/14/20 1228)  lidocaine (PF) (XYLOCAINE) 1 % injection 10 mL (has no administration in time range)  Tdap (BOOSTRIX) injection 0.5 mL (0.5 mLs Intramuscular Given 01/14/20 1441)    ED Course  I have reviewed the triage vital signs and the nursing notes.  Pertinent labs & imaging results that were available during my care of the patient were reviewed by me and considered in my medical decision making (see chart for details).    MDM Rules/Calculators/A&P                          44 year old male presents with metal framing nail in his index  finger.  It injured through the digit pad and is coming out through the center of the nail.  The finger is neurovascularly intact, no active bleeding.  X-ray shows that this does go through the distal tuft of the bone, but there is no significant bony damage.  I discussed case with PA Dale [place]  On call for Hand Surgery, given bony involvement, he and Dr. Janee Morn with hand surgery recommend performing digital block and removing nail and then making a small incision over the entry wound on the finger pad to help prevent development of felon.   After digital block was able to easily remove the nail, made 1/4  inch incision over entry wound patient tolerated procedure extremely well.  Dressing placed.  Will have patient keep wound clean and dry, Keflex prescribed for infection prophylaxis.  Dressing placed in the ED, discussed infection precautions.  Dr. Janee Morn the patient to schedule follow-up appointment.  Last tetanus vaccine has been updated.  At this time he is, for discharge home and expresses understanding and agreement.  Final Clinical Impression(s) / ED Diagnoses Final diagnoses:  Foreign body of left index finger    Rx / DC Orders ED Discharge Orders         Ordered    cephALEXin (KEFLEX) 500 MG capsule  4 times daily        01/14/20 1523           Dartha Lodge, [place] 01/14/20 1537    Little, Ambrose [place], MD 01/15/20 [phone]

## 2020-01-14 NOTE — Consult Note (Addendum)
Reason for Consult:Index finger FB Referring Physician: R Little  Kevin Ortega is an 44 y.o. male.  HPI: Caitlin was working and put a nail through his left index finger with a nail gun. He came to the ED for evaluation. X-rays showed the nail through the distal phalanx and hand surgery was consulted. He is RHD.  Past Medical History:  Diagnosis Date   Allergy     History reviewed. No pertinent surgical history.  No family history on file.  Social History:  reports that he has never smoked. He has never used smokeless tobacco. He reports that he does not drink alcohol and does not use drugs.  Allergies: No Known Allergies  Medications: I have reviewed the patient's current medications.  No results found for this or any previous visit (from the past 48 hour(s)).  DG Finger Index Left  Result Date: 01/14/2020 CLINICAL DATA:  44 year old male with penetrating trauma from nail gun. EXAM: LEFT INDEX FINGER 2+V COMPARISON:  None. FINDINGS: Normal underlying bone mineralization, joint spaces and alignment. A linear 22 mm metal foreign body traverses the tuft of the 2nd distal phalanx in an anterior to posterior direction. But the underlying bone appears to remain intact. Other visible osseous structures appear normal. IMPRESSION: Linear 22 mm metal foreign body traverses the tuft of the 2nd distal phalanx. But the underlying bone remains otherwise intact. Electronically Signed   By: Odessa Fleming M.D.   On: 01/14/2020 12:54    Review of Systems  HENT: Negative for ear discharge, ear pain, hearing loss and tinnitus.   Eyes: Negative for photophobia and pain.  Respiratory: Negative for cough and shortness of breath.   Cardiovascular: Negative for chest pain.  Gastrointestinal: Negative for abdominal pain, nausea and vomiting.  Genitourinary: Negative for dysuria, flank pain, frequency and urgency.  Musculoskeletal: Positive for arthralgias (Left index finger). Negative for back pain, myalgias and  neck pain.  Neurological: Negative for dizziness and headaches.  Hematological: Does not bruise/bleed easily.  Psychiatric/Behavioral: The patient is not nervous/anxious.    Blood pressure 118/76, pulse 60, temperature 97.6 F (36.4 C), temperature source Oral, resp. rate 16, height 5\' 10"  (1.778 m), weight 72.6 kg, SpO2 99 %. Physical Exam Constitutional:      General: He is not in acute distress.    Appearance: He is well-developed. He is not diaphoretic.  HENT:     Head: Normocephalic and atraumatic.  Eyes:     General: No scleral icterus.       Right eye: No discharge.        Left eye: No discharge.     Conjunctiva/sclera: Conjunctivae normal.  Cardiovascular:     Rate and Rhythm: Normal rate and regular rhythm.  Pulmonary:     Effort: Pulmonary effort is normal. No respiratory distress.  Musculoskeletal:     Cervical back: Normal range of motion.     Comments: Left shoulder, elbow, wrist, digits- Small nail through index finger P3 including nail, no instability, no blocks to motion  Sens  Ax/R/M/U intact  Mot   Ax/ R/ PIN/ M/ AIN/ U intact  Rad 2+  Skin:    General: Skin is warm and dry.  Neurological:     Mental Status: He is alert.  Psychiatric:        Behavior: Behavior normal.     Assessment/Plan: FB left index finger -- Plan FB removal under digital block by EDPA with incision of the pad puncture wound to ~0.25in to facilitate drainage.  F/u with Dr. Janee Morn in office.    Freeman Caldron, PA-C Orthopedic Surgery [phone] 01/14/2020, 3:03 PM   Kevin Ortega is an 44 y.o. male.  HPI: This patient was working and put a Radio broadcast assistant nail nail through his left index finger with a nail gun. He came to the ED for evaluation. X-rays showed the nail through the distal phalanx and hand surgery was consulted. He is RHD.  Past Medical History:  Diagnosis Date   Allergy     History reviewed. No pertinent surgical history.  No family history on  file.  Social History:  reports that he has never smoked. He has never used smokeless tobacco. He reports that he does not drink alcohol and does not use drugs.  Allergies: No Known Allergies  Medications: I have reviewed the patient's current medications.  No results found for this or any previous visit (from the past 48 hour(s)).  DG Finger Index Left  Result Date: 01/14/2020 CLINICAL DATA:  44 year old male with penetrating trauma from nail gun. EXAM: LEFT INDEX FINGER 2+V COMPARISON:  None. FINDINGS: Normal underlying bone mineralization, joint spaces and alignment. A linear 22 mm metal foreign body traverses the tuft of the 2nd distal phalanx in an anterior to posterior direction. But the underlying bone appears to remain intact. Other visible osseous structures appear normal. IMPRESSION: Linear 22 mm metal foreign body traverses the tuft of the 2nd distal phalanx. But the underlying bone remains otherwise intact. Electronically Signed   By: Odessa Fleming M.D.   On: 01/14/2020 12:54    Review of Systems  HENT: Negative for ear discharge, ear pain, hearing loss and tinnitus.   Eyes: Negative for photophobia and pain.  Respiratory: Negative for cough and shortness of breath.   Cardiovascular: Negative for chest pain.  Gastrointestinal: Negative for abdominal pain, nausea and vomiting.  Genitourinary: Negative for dysuria, flank pain, frequency and urgency.  Musculoskeletal: Positive for arthralgias (Left index finger). Negative for back pain, myalgias and neck pain.  Neurological: Negative for dizziness and headaches.  Hematological: Does not bruise/bleed easily.  Psychiatric/Behavioral: The patient is not nervous/anxious.    Blood pressure 118/76, pulse 60, temperature 97.6 F (36.4 C), temperature source Oral, resp. rate 16, height 5\' 10"  (1.778 m), weight 72.6 kg, SpO2 99 %. Physical Exam Constitutional:      General: He is not in acute distress.    Appearance: He is well-developed.  He is not diaphoretic.  HENT:     Head: Normocephalic and atraumatic.  Eyes:     General: No scleral icterus.       Right eye: No discharge.        Left eye: No discharge.     Conjunctiva/sclera: Conjunctivae normal.  Cardiovascular:     Rate and Rhythm: Normal rate and regular rhythm.  Pulmonary:     Effort: Pulmonary effort is normal. No respiratory distress.  Musculoskeletal:     Cervical back: Normal range of motion.     Comments: Left shoulder, elbow, wrist, digits- Small nail through index finger P3 including nail, no instability, no blocks to motion  Sens  Ax/R/M/U intact  Mot   Ax/ R/ PIN/ M/ AIN/ U intact  Rad 2+  Skin:    General: Skin is warm and dry.  Neurological:     Mental Status: He is alert.  Psychiatric:        Behavior: Behavior normal.     Assessment/Plan:  Nail gun injury to left index fingertip.  Recommend digital block, manual extraction of the nail, opening the palmar entrance site at least 1/4 inch or so as a prophylactic measure to help prevent felon formation.  Outpatient oral antibiotics would be appropriate for a few days, my office will contact him tomorrow for continued care.  Mack Hook, MD Hand Surgery

## 2020-01-14 NOTE — ED Triage Notes (Signed)
Onset today injury to left index finger nail bed from nail gun. Nail stuck in the middle of nail and come out on the other side of finger. Pain 6/10 achy sore. Took Advil 500 mg prior to arrival.

## 2020-01-23 ENCOUNTER — Other Ambulatory Visit: Payer: Self-pay

## 2020-01-24 ENCOUNTER — Encounter: Payer: Self-pay | Admitting: Family Medicine

## 2020-01-24 ENCOUNTER — Ambulatory Visit (INDEPENDENT_AMBULATORY_CARE_PROVIDER_SITE_OTHER): Payer: Self-pay | Admitting: Family Medicine

## 2020-01-24 VITALS — BP 118/72 | HR 62 | Temp 97.8°F | Ht 70.0 in | Wt 171.8 lb

## 2020-01-24 DIAGNOSIS — Z0000 Encounter for general adult medical examination without abnormal findings: Secondary | ICD-10-CM | POA: Insufficient documentation

## 2020-01-24 DIAGNOSIS — L308 Other specified dermatitis: Secondary | ICD-10-CM

## 2020-01-24 DIAGNOSIS — L309 Dermatitis, unspecified: Secondary | ICD-10-CM | POA: Insufficient documentation

## 2020-01-24 MED ORDER — TRIAMCINOLONE ACETONIDE 0.1 % EX OINT
1.0000 | TOPICAL_OINTMENT | Freq: Two times a day (BID) | CUTANEOUS | 1 refills | Status: AC
Start: 2020-01-24 — End: ?

## 2020-01-24 NOTE — Progress Notes (Signed)
Established Patient Office Visit  Subjective:  Patient ID: Kevin Ortega, male    DOB: 06/28/1975  Age: 44 y.o. MRN: [id number]  CC:  Chief Complaint  Patient presents with  . Transitions Of Care    TOC from Dr. Selena Batten, dry patch on right arm very itchy patient would like checked.     HPI Kevin Ortega presents for establishment of care a complete physical exam and follow-up of persisting rash in his right forearm area.  Patient does not smoke, drink alcohol or use illicit drugs.  He is married and lives with his wife and 2 sons.  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  Father is 34 and has recovered from bladder cancer status post cystectomy.  He has never smoked.  Mom is in her upper 65s and in good health.  Patient is seeing dermatology for multiple nevi in the last 2 to 3 years.  Past Medical History:  Diagnosis Date  . Allergy     History reviewed. No pertinent surgical history.  Family History  Problem Relation Age of Onset  . Healthy Mother   . Healthy Father   . Cancer Father     Social History   Socioeconomic History  . Marital status: Married    Spouse name: Not on file  . Number of children: Not on file  . Years of education: Not on file  . Highest education level: Not on file  Occupational History  . Not on file  Tobacco Use  . Smoking status: Never Smoker  . Smokeless tobacco: Never Used  Vaping Use  . Vaping Use: Never used  Substance and Sexual Activity  . Alcohol use: No  . Drug use: No  . Sexual activity: Yes  Other Topics Concern  . Not on file  Social History Narrative   Work or School: self employed interior [employer] Situation: lives with wife and two son      Spiritual Beliefs: Christian      Lifestyle: no regular exercise - work is active and has a garden; diet is pretty healthy      Social Determinants of Corporate investment banker Strain:   . Difficulty of Paying Living Expenses: Not on file  Food  Insecurity:   . Worried About Programme researcher, broadcasting/film/video in the Last Year: Not on file  . Ran Out of Food in the Last Year: Not on file  Transportation Needs:   . Lack of Transportation (Medical): Not on file  . Lack of Transportation (Non-Medical): Not on file  Physical Activity:   . Days of Exercise per Week: Not on file  . Minutes of Exercise per Session: Not on file  Stress:   . Feeling of Stress : Not on file  Social Connections:   . Frequency of Communication with Friends and Family: Not on file  . Frequency of Social Gatherings with Friends and Family: Not on file  . Attends Religious Services: Not on file  . Active Member of Clubs or Organizations: Not on file  . Attends Banker Meetings: Not on file  . Marital Status: Not on file  Intimate Partner Violence:   . Fear of Current or Ex-Partner: Not on file  . Emotionally Abused: Not on file  . Physically Abused: Not on file  . Sexually Abused: Not on file    Outpatient Medications Prior to Visit  Medication Sig Dispense Refill  .  cephALEXin (KEFLEX) 500 MG capsule Take 1 capsule (500 mg total) by mouth 4 (four) times daily. (Patient not taking: Reported on 01/24/2020) 28 capsule 0   No facility-administered medications prior to visit.    No Known Allergies  ROS Review of Systems  Constitutional: Negative.   HENT: Negative.   Eyes: Negative for photophobia and visual disturbance.  Respiratory: Negative.   Cardiovascular: Negative.   Gastrointestinal: Negative.   Endocrine: Negative for polyphagia and polyuria.  Genitourinary: Negative.   Musculoskeletal: Negative for gait problem and joint swelling.  Skin: Positive for rash.  Allergic/Immunologic: Negative for immunocompromised state.  Neurological: Negative for seizures and numbness.  Hematological: Does not bruise/bleed easily.  Psychiatric/Behavioral: Negative.  Negative for behavioral problems and dysphoric mood. The patient is not nervous/anxious.     Depression screen [facility] 2/9 01/24/2020 01/24/2020 08/22/2017  Decreased Interest 0 0 0  Down, Depressed, Hopeless 0 0 0  PHQ - 2 Score 0 0 0  Altered sleeping 0 - 0  Tired, decreased energy 0 - 0  Change in appetite 0 - 0  Feeling bad or failure about yourself  0 - 0  Trouble concentrating 0 - 0  Moving slowly or fidgety/restless 0 - 0  Suicidal thoughts 0 - 0  PHQ-9 Score 0 - 0  Difficult doing work/chores Not difficult at all - -      Objective:    Physical Exam Vitals and nursing note reviewed.  Constitutional:      General: He is not in acute distress.    Appearance: Normal appearance. He is obese. He is not ill-appearing, toxic-appearing or diaphoretic.  HENT:     Head: Normocephalic and atraumatic.     Right Ear: Tympanic membrane, ear canal and external ear normal.     Left Ear: Tympanic membrane, ear canal and external ear normal.     Mouth/Throat:     Mouth: Mucous membranes are moist.     Pharynx: Oropharynx is clear. No oropharyngeal exudate or posterior oropharyngeal erythema.  Eyes:     General: No scleral icterus.       Right eye: No discharge.        Left eye: No discharge.     Extraocular Movements: Extraocular movements intact.     Conjunctiva/sclera: Conjunctivae normal.     Pupils: Pupils are equal, round, and reactive to light.  Cardiovascular:     Rate and Rhythm: Normal rate and regular rhythm.  Pulmonary:     Effort: Pulmonary effort is normal.     Breath sounds: Normal breath sounds.  Abdominal:     General: Abdomen is flat. Bowel sounds are normal. There is no distension.     Palpations: Abdomen is soft. There is no mass.     Tenderness: There is no abdominal tenderness. There is no guarding or rebound.     Hernia: No hernia is present. There is no hernia in the left inguinal area or right inguinal area.  Genitourinary:    Penis: No hypospadias, erythema, tenderness, discharge, swelling or lesions.      Testes:        Right: Mass, tenderness or  swelling not present. Right testis is descended.        Left: Mass, tenderness or swelling not present. Left testis is descended.     Epididymis:     Right: Not inflamed or enlarged.     Left: Not inflamed or enlarged.  Musculoskeletal:     Cervical back: Normal range of motion. No  rigidity or tenderness.     Right lower leg: No edema.     Left lower leg: No edema.  Lymphadenopathy:     Cervical: No cervical adenopathy.     Lower Body: No right inguinal adenopathy. No left inguinal adenopathy.  Skin:    General: Skin is warm and dry.  Neurological:     Mental Status: He is alert and oriented to person, place, and time.  Psychiatric:        Mood and Affect: Mood normal.        Behavior: Behavior normal.     BP 118/72   Pulse 62   Temp 97.8 F (36.6 C) (Tympanic)   Ht 5\' 10"  (1.778 m)   Wt 171 lb 12.8 oz (77.9 kg)   SpO2 96%   BMI 24.65 kg/m  Wt Readings from Last 3 Encounters:  01/24/20 171 lb 12.8 oz (77.9 kg)  01/14/20 160 lb (72.6 kg)  08/22/17 167 lb 11.2 oz (76.1 kg)     Health Maintenance Due  Topic Date Due  . Hepatitis C Screening  Never done  . INFLUENZA VACCINE  Never done  . COVID-19 Vaccine (2 - Moderna 2-dose series) 01/26/2020    There are no preventive care reminders to display for this patient.  Lab Results  Component Value Date   TSH 2.83 03/31/2011   Lab Results  Component Value Date   WBC 5.1 09/16/2015   HGB 14.1 09/16/2015   HCT 41.4 09/16/2015   MCV 88.6 09/16/2015   PLT 204.0 09/16/2015   Lab Results  Component Value Date   NA 139 09/16/2015   K 4.2 09/16/2015   CO2 33 (H) 09/16/2015   GLUCOSE 78 09/16/2015   BUN 14 09/16/2015   CREATININE 1.02 09/16/2015   BILITOT 0.6 03/22/2012   ALKPHOS 54 03/22/2012   AST 25 03/22/2012   ALT 31 03/22/2012   PROT 7.1 03/22/2012   ALBUMIN 4.0 03/22/2012   CALCIUM 9.8 09/16/2015   GFR 86.57 09/16/2015   Lab Results  Component Value Date   CHOL 158 08/22/2017   Lab Results    Component Value Date   HDL 57.80 08/22/2017   Lab Results  Component Value Date   LDLCALC 86 08/22/2017   Lab Results  Component Value Date   TRIG 72.0 08/22/2017   Lab Results  Component Value Date   CHOLHDL 3 08/22/2017   Lab Results  Component Value Date   HGBA1C 5.6 08/22/2017      Assessment & Plan:   Problem List Items Addressed This Visit      Musculoskeletal and Integument   Eczema   Relevant Medications   triamcinolone ointment (KENALOG) 0.1 %     Other   Healthcare maintenance - Primary   Relevant Orders   Comprehensive metabolic panel   CBC   Lipid panel   Urinalysis, Routine w reflex microscopic   TSH      Meds ordered this encounter  Medications  . triamcinolone ointment (KENALOG) 0.1 %    Sig: Apply 1 application topically 2 (two) times daily.    Dispense:  30 g    Refill:  1    Follow-up: Return in about 1 year (around 01/23/2021), or return for check of rash if not cleared with steroid ointment..  Given information on health maintenance and disease prevention as well as eczema.  RTC in 1 year or sooner if rash does not clear given ointment.  We will also consider dermatology referral.  Libby Maw, MD

## 2020-01-24 NOTE — Patient Instructions (Signed)
Eczema Eczema is a broad term for a group of skin conditions that cause skin to become rough and inflamed. Each type of eczema has different triggers, symptoms, and treatments. Eczema of any type is usually itchy and symptoms range from mild to severe. Eczema and its symptoms are not spread from person to person (are not contagious). It can appear on different parts of the body at different times. Your eczema may not look the same as someone else's eczema. What are the types of eczema? Atopic dermatitis This is a long-term (chronic) skin disease that keeps coming back (recurring). Usual symptoms are dry skin and small, solid pimples that may swell and leak fluid (weep). Contact dermatitis  This happens when something irritates the skin and causes a rash. The irritation can come from substances that you are allergic to (allergens), such as poison ivy, chemicals, or medicines that were applied to your skin. Dyshidrotic eczema This is a form of eczema on the hands and feet. It shows up as very itchy, fluid-filled blisters. It can affect people of any age, but is more common before age 67. Hand eczema  This causes very itchy areas of skin on the palms and sides of the hands and fingers. This type of eczema is common in industrial jobs where you may be exposed to many different types of irritants. Lichen simplex chronicus This type of eczema occurs when a person constantly scratches one area of the body. Repeated scratching of the area leads to thickened skin (lichenification). Lichen simplex chronicus can occur along with other types of eczema. It is more common in adults, but may be seen in children as well. Nummular eczema This is a common type of eczema. It has no known cause. It typically causes a red, circular, crusty lesion (plaque) that may be itchy. Scratching may become a habit and can cause bleeding. Nummular eczema occurs most often in people of middle-age or older. It most often affects the  hands. Seborrheic dermatitis This is a common skin disease that mainly affects the scalp. It may also affect any oily areas of the body, such as the face, sides of nose, eyebrows, ears, eyelids, and chest. It is marked by small scaling and redness of the skin (erythema). This can affect people of all ages. In infants, this condition is known as Chartered certified accountant." Stasis dermatitis This is a common skin disease that usually appears on the legs and feet. It most often occurs in people who have a condition that prevents blood from being pumped through the veins in the legs (chronic venous insufficiency). Stasis dermatitis is a chronic condition that needs long-term management. How is eczema diagnosed? Your health care provider will examine your skin and review your medical history. He or she may also give you skin patch tests. These tests involve taking patches that contain possible allergens and placing them on your back. He or she will then check in a few days to see if an allergic reaction occurred. What are the common treatments? Treatment for eczema is based on the type of eczema you have. Hydrocortisone steroid medicine can relieve itching quickly and help reduce inflammation. This medicine may be prescribed or obtained over-the-counter, depending on the strength of the medicine that is needed. Follow these instructions at home:  Take over-the-counter and prescription medicines only as told by your health care provider.  Use creams or ointments to moisturize your skin. Do not use lotions.  Learn what triggers or irritates your symptoms. Avoid these things.  Treat symptom flare-ups quickly.  Do not itch your skin. This can make your rash worse.  Keep all follow-up visits as told by your health care provider. This is important. Where to find more information  The American Academy of Dermatology: http://jones-macias.info/  The National Eczema Association: www.nationaleczema.org Contact a health care provider  if:  You have serious itching, even with treatment.  You regularly scratch your skin until it bleeds.  Your rash looks different than usual.  Your skin is painful, swollen, or more red than usual.  You have a fever. Summary  There are eight general types of eczema. Each type has different triggers.  Eczema of any type causes itching that may range from mild to severe.  Treatment varies based on the type of eczema you have. Hydrocortisone steroid medicine can help with itching and inflammation.  Protecting your skin is the best way to prevent eczema. Use moisturizers and lotions. Avoid triggers and irritants, and treat flare-ups quickly. This information is not intended to replace advice given to you by your health care provider. Make sure you discuss any questions you have with your health care provider. Document Revised: 04/22/2017 Document Reviewed: 09/23/2016 Elsevier Patient Education  [place] Maintenance, Male Adopting a healthy lifestyle and getting preventive care are important in promoting health and wellness. Ask your health care provider about:  The right schedule for you to have regular tests and exams.  Things you can do on your own to prevent diseases and keep yourself healthy. What should I know about diet, weight, and exercise? Eat a healthy diet   Eat a diet that includes plenty of vegetables, fruits, low-fat dairy products, and lean protein.  Do not eat a lot of foods that are high in solid fats, added sugars, or sodium. Maintain a healthy weight Body mass index (BMI) is a measurement that can be used to identify possible weight problems. It estimates body fat based on height and weight. Your health care provider can help determine your BMI and help you achieve or maintain a healthy weight. Get regular exercise Get regular exercise. This is one of the most important things you can do for your health. Most adults should:  Exercise for at  least 150 minutes each week. The exercise should increase your heart rate and make you sweat (moderate-intensity exercise).  Do strengthening exercises at least twice a week. This is in addition to the moderate-intensity exercise.  Spend less time sitting. Even light physical activity can be beneficial. Watch cholesterol and blood lipids Have your blood tested for lipids and cholesterol at 43 years of age, then have this test every 5 years. You may need to have your cholesterol levels checked more often if:  Your lipid or cholesterol levels are high.  You are older than 43 years of age.  You are at high risk for heart disease. What should I know about cancer screening? Many types of cancers can be detected early and may often be prevented. Depending on your health history and family history, you may need to have cancer screening at various ages. This may include screening for:  Colorectal cancer.  Prostate cancer.  Skin cancer.  Lung cancer. What should I know about heart disease, diabetes, and high blood pressure? Blood pressure and heart disease  High blood pressure causes heart disease and increases the risk of stroke. This is more likely to develop in people who have high blood pressure readings, are of African descent, or are overweight.  Talk with your health care provider about your target blood pressure readings.  Have your blood pressure checked: ? Every 3-5 years if you are 20-9 years of age. ? Every year if you are 31 years old or older.  If you are between the ages of 74 and 26 and are a current or former smoker, ask your health care provider if you should have a one-time screening for abdominal aortic aneurysm (AAA). Diabetes Have regular diabetes screenings. This checks your fasting blood sugar level. Have the screening done:  Once every three years after age 12 if you are at a normal weight and have a low risk for diabetes.  More often and at a younger age if  you are overweight or have a high risk for diabetes. What should I know about preventing infection? Hepatitis B If you have a higher risk for hepatitis B, you should be screened for this virus. Talk with your health care provider to find out if you are at risk for hepatitis B infection. Hepatitis C Blood testing is recommended for:  Everyone born from [birth year] through 1964.  Anyone with known risk factors for hepatitis C. Sexually transmitted infections (STIs)  You should be screened each year for STIs, including gonorrhea and chlamydia, if: ? You are sexually active and are younger than 43 years of age. ? You are older than 43 years of age and your health care provider tells you that you are at risk for this type of infection. ? Your sexual activity has changed since you were last screened, and you are at increased risk for chlamydia or gonorrhea. Ask your health care provider if you are at risk.  Ask your health care provider about whether you are at high risk for HIV. Your health care provider may recommend a prescription medicine to help prevent HIV infection. If you choose to take medicine to prevent HIV, you should first get tested for HIV. You should then be tested every 3 months for as long as you are taking the medicine. Follow these instructions at home: Lifestyle  Do not use any products that contain nicotine or tobacco, such as cigarettes, e-cigarettes, and chewing tobacco. If you need help quitting, ask your health care provider.  Do not use street drugs.  Do not share needles.  Ask your health care provider for help if you need support or information about quitting drugs. Alcohol use  Do not drink alcohol if your health care provider tells you not to drink.  If you drink alcohol: ? Limit how much you have to 0-2 drinks a day. ? Be aware of how much alcohol is in your drink. In the [place] one drink equals one 12 oz bottle of beer (355 mL), one 5 oz glass of wine (148 mL), or  one 1 oz glass of hard liquor (44 mL). General instructions  Schedule regular health, dental, and eye exams.  Stay current with your vaccines.  Tell your health care provider if: ? You often feel depressed. ? You have ever been abused or do not feel safe at home. Summary  Adopting a healthy lifestyle and getting preventive care are important in promoting health and wellness.  Follow your health care provider's instructions about healthy diet, exercising, and getting tested or screened for diseases.  Follow your health care provider's instructions on monitoring your cholesterol and blood pressure. This information is not intended to replace advice given to you by your health care provider. Make sure you discuss any questions  you have with your health care provider. Document Revised: 05/03/2018 Document Reviewed: 05/03/2018 Elsevier Patient Education  2020 [employer]  Preventive Care 15-28 Years Old, Male Preventive care refers to lifestyle choices and visits with your health care provider that can promote health and wellness. This includes:  A yearly physical exam. This is also called an annual well check.  Regular dental and eye exams.  Immunizations.  Screening for certain conditions.  Healthy lifestyle choices, such as eating a healthy diet, getting regular exercise, not using drugs or products that contain nicotine and tobacco, and limiting alcohol use. What can I expect for my preventive care visit? Physical exam Your health care provider will check:  Height and weight. These may be used to calculate body mass index (BMI), which is a measurement that tells if you are at a healthy weight.  Heart rate and blood pressure.  Your skin for abnormal spots. Counseling Your health care provider may ask you questions about:  Alcohol, tobacco, and drug use.  Emotional well-being.  Home and relationship well-being.  Sexual activity.  Eating habits.  Work and work  Statistician. What immunizations do I need?  Influenza (flu) vaccine  This is recommended every year. Tetanus, diphtheria, and pertussis (Tdap) vaccine  You may need a Td booster every 10 years. Varicella (chickenpox) vaccine  You may need this vaccine if you have not already been vaccinated. Zoster (shingles) vaccine  You may need this after age 57. Measles, mumps, and rubella (MMR) vaccine  You may need at least one dose of MMR if you were born in 1957 or later. You may also need a second dose. Pneumococcal conjugate (PCV13) vaccine  You may need this if you have certain conditions and were not previously vaccinated. Pneumococcal polysaccharide (PPSV23) vaccine  You may need one or two doses if you smoke cigarettes or if you have certain conditions. Meningococcal conjugate (MenACWY) vaccine  You may need this if you have certain conditions. Hepatitis A vaccine  You may need this if you have certain conditions or if you travel or work in places where you may be exposed to hepatitis A. Hepatitis B vaccine  You may need this if you have certain conditions or if you travel or work in places where you may be exposed to hepatitis B. Haemophilus influenzae type b (Hib) vaccine  You may need this if you have certain risk factors. Human papillomavirus (HPV) vaccine  If recommended by your health care provider, you may need three doses over 6 months. You may receive vaccines as individual doses or as more than one vaccine together in one shot (combination vaccines). Talk with your health care provider about the risks and benefits of combination vaccines. What tests do I need? Blood tests  Lipid and cholesterol levels. These may be checked every 5 years, or more frequently if you are over 37 years old.  Hepatitis C test.  Hepatitis B test. Screening  Lung cancer screening. You may have this screening every year starting at age 51 if you have a 30-pack-year history of smoking  and currently smoke or have quit within the past 15 years.  Prostate cancer screening. Recommendations will vary depending on your family history and other risks.  Colorectal cancer screening. All adults should have this screening starting at age 36 and continuing until age 8. Your health care provider may recommend screening at age 33 if you are at increased risk. You will have tests every 1-10 years, depending on your results and  the type of screening test.  Diabetes screening. This is done by checking your blood sugar (glucose) after you have not eaten for a while (fasting). You may have this done every 1-3 years.  Sexually transmitted disease (STD) testing. Follow these instructions at home: Eating and drinking  Eat a diet that includes fresh fruits and vegetables, whole grains, lean protein, and low-fat dairy products.  Take vitamin and mineral supplements as recommended by your health care provider.  Do not drink alcohol if your health care provider tells you not to drink.  If you drink alcohol: ? Limit how much you have to 0-2 drinks a day. ? Be aware of how much alcohol is in your drink. In the [place] one drink equals one 12 oz bottle of beer (355 mL), one 5 oz glass of wine (148 mL), or one 1 oz glass of hard liquor (44 mL). Lifestyle  Take daily care of your teeth and gums.  Stay active. Exercise for at least 30 minutes on 5 or more days each week.  Do not use any products that contain nicotine or tobacco, such as cigarettes, e-cigarettes, and chewing tobacco. If you need help quitting, ask your health care provider.  If you are sexually active, practice safe sex. Use a condom or other form of protection to prevent STIs (sexually transmitted infections).  Talk with your health care provider about taking a low-dose aspirin every day starting at age 23. What's next?  Go to your health care provider once a year for a well check visit.  Ask your health care provider how  often you should have your eyes and teeth checked.  Stay up to date on all vaccines. This information is not intended to replace advice given to you by your health care provider. Make sure you discuss any questions you have with your health care provider. Document Revised: 05/04/2018 Document Reviewed: 05/04/2018 Elsevier Patient Education  2020 [employer].

## 2021-01-27 ENCOUNTER — Ambulatory Visit: Payer: Self-pay | Admitting: Family Medicine

## 2021-04-13 ENCOUNTER — Ambulatory Visit: Payer: Self-pay | Admitting: Family
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
